# Patient Record
Sex: Male | Born: 1950 | Race: White | Hispanic: No | Marital: Married | Smoking: Never smoker
Health system: Southern US, Community
[De-identification: ages and names within clinical notes are randomized; demographics above are authoritative.]

## PROBLEM LIST (undated history)

## (undated) DIAGNOSIS — K5730 Diverticulosis of large intestine without perforation or abscess without bleeding: Secondary | ICD-10-CM

## (undated) DIAGNOSIS — N200 Calculus of kidney: Secondary | ICD-10-CM

## (undated) DIAGNOSIS — S46219A Strain of muscle, fascia and tendon of other parts of biceps, unspecified arm, initial encounter: Secondary | ICD-10-CM

## (undated) DIAGNOSIS — I10 Essential (primary) hypertension: Secondary | ICD-10-CM

## (undated) DIAGNOSIS — S43439A Superior glenoid labrum lesion of unspecified shoulder, initial encounter: Secondary | ICD-10-CM

## (undated) DIAGNOSIS — T7840XA Allergy, unspecified, initial encounter: Secondary | ICD-10-CM

## (undated) DIAGNOSIS — U071 COVID-19: Secondary | ICD-10-CM

## (undated) HISTORY — DX: COVID-19: U07.1

## (undated) HISTORY — DX: Calculus of kidney: N20.0

## (undated) HISTORY — DX: Essential (primary) hypertension: I10

## (undated) HISTORY — DX: Diverticulosis of large intestine without perforation or abscess without bleeding: K57.30

## (undated) HISTORY — DX: Allergy, unspecified, initial encounter: T78.40XA

---

## 2002-04-09 ENCOUNTER — Encounter: Payer: Self-pay | Admitting: Internal Medicine

## 2002-04-09 ENCOUNTER — Ambulatory Visit (HOSPITAL_COMMUNITY): Admission: RE | Admit: 2002-04-09 | Discharge: 2002-04-09 | Payer: Self-pay | Admitting: Internal Medicine

## 2004-02-21 ENCOUNTER — Ambulatory Visit: Payer: Self-pay | Admitting: Family Medicine

## 2004-09-01 ENCOUNTER — Ambulatory Visit: Payer: Self-pay | Admitting: Family Medicine

## 2005-02-09 ENCOUNTER — Ambulatory Visit: Payer: Self-pay | Admitting: Family Medicine

## 2005-02-12 ENCOUNTER — Ambulatory Visit: Payer: Self-pay | Admitting: Family Medicine

## 2005-04-06 ENCOUNTER — Ambulatory Visit: Payer: Self-pay | Admitting: Family Medicine

## 2005-04-06 ENCOUNTER — Ambulatory Visit: Payer: Self-pay | Admitting: *Deleted

## 2006-03-16 ENCOUNTER — Ambulatory Visit: Payer: Self-pay | Admitting: Family Medicine

## 2009-02-11 ENCOUNTER — Telehealth: Payer: Self-pay | Admitting: Family Medicine

## 2009-02-11 ENCOUNTER — Ambulatory Visit: Payer: Self-pay | Admitting: Family Medicine

## 2009-02-11 DIAGNOSIS — I10 Essential (primary) hypertension: Secondary | ICD-10-CM

## 2009-02-17 ENCOUNTER — Ambulatory Visit: Payer: Self-pay | Admitting: Family Medicine

## 2009-02-17 DIAGNOSIS — M19079 Primary osteoarthritis, unspecified ankle and foot: Secondary | ICD-10-CM | POA: Insufficient documentation

## 2009-02-18 DIAGNOSIS — E785 Hyperlipidemia, unspecified: Secondary | ICD-10-CM

## 2009-02-18 DIAGNOSIS — K5730 Diverticulosis of large intestine without perforation or abscess without bleeding: Secondary | ICD-10-CM | POA: Insufficient documentation

## 2009-03-05 HISTORY — PX: FOOT SURGERY: SHX648

## 2009-03-19 ENCOUNTER — Ambulatory Visit: Payer: Self-pay | Admitting: Family Medicine

## 2009-03-19 LAB — CONVERTED CEMR LAB
ALT: 15 units/L (ref 0–53)
AST: 21 units/L (ref 0–37)
Albumin: 4.1 g/dL (ref 3.5–5.2)
Alkaline Phosphatase: 66 units/L (ref 39–117)
BUN: 24 mg/dL — ABNORMAL HIGH (ref 6–23)
Basophils Absolute: 0.1 10*3/uL (ref 0.0–0.1)
Basophils Relative: 0.6 % (ref 0.0–3.0)
Bilirubin, Direct: 0 mg/dL (ref 0.0–0.3)
CO2: 28 meq/L (ref 19–32)
Calcium: 9.4 mg/dL (ref 8.4–10.5)
Chloride: 103 meq/L (ref 96–112)
Cholesterol: 210 mg/dL — ABNORMAL HIGH (ref 0–200)
Creatinine, Ser: 1.8 mg/dL — ABNORMAL HIGH (ref 0.4–1.5)
Direct LDL: 144.3 mg/dL
Eosinophils Absolute: 0.3 10*3/uL (ref 0.0–0.7)
Eosinophils Relative: 3 % (ref 0.0–5.0)
GFR calc non Af Amer: 41.61 mL/min (ref >60)
Glucose, Bld: 88 mg/dL (ref 70–99)
HCT: 41.2 % (ref 39.0–52.0)
HDL: 43.2 mg/dL (ref >39.00)
Hemoglobin: 13.6 g/dL (ref 13.0–17.0)
Lymphocytes Relative: 31.1 % (ref 12.0–46.0)
Lymphs Abs: 2.8 10*3/uL (ref 0.7–4.0)
MCHC: 33.1 g/dL (ref 30.0–36.0)
MCV: 87.4 fL (ref 78.0–100.0)
Monocytes Absolute: 0.7 10*3/uL (ref 0.1–1.0)
Monocytes Relative: 7.4 % (ref 3.0–12.0)
Neutro Abs: 5 10*3/uL (ref 1.4–7.7)
Neutrophils Relative %: 57.9 % (ref 43.0–77.0)
PSA: 0.83 ng/mL (ref 0.10–4.00)
Platelets: 306 10*3/uL (ref 150.0–400.0)
Potassium: 4.2 meq/L (ref 3.5–5.1)
RBC: 4.71 M/uL (ref 4.22–5.81)
RDW: 12.3 % (ref 11.5–14.6)
Sodium: 138 meq/L (ref 135–145)
TSH: 3.44 microintl units/mL (ref 0.35–5.50)
Total Bilirubin: 0.5 mg/dL (ref 0.3–1.2)
Total CHOL/HDL Ratio: 5
Total Protein: 7.4 g/dL (ref 6.0–8.3)
Triglycerides: 211 mg/dL — ABNORMAL HIGH (ref 0.0–149.0)
VLDL: 42.2 mg/dL — ABNORMAL HIGH (ref 0.0–40.0)
WBC: 8.9 10*3/uL (ref 4.5–10.5)

## 2009-03-20 LAB — CONVERTED CEMR LAB: Vit D, 25-Hydroxy: 40 ng/mL

## 2009-03-24 ENCOUNTER — Ambulatory Visit: Payer: Self-pay | Admitting: Family Medicine

## 2009-03-28 ENCOUNTER — Telehealth: Payer: Self-pay | Admitting: Family Medicine

## 2009-04-09 ENCOUNTER — Ambulatory Visit: Payer: Self-pay | Admitting: Family Medicine

## 2009-04-09 ENCOUNTER — Encounter (INDEPENDENT_AMBULATORY_CARE_PROVIDER_SITE_OTHER): Payer: Self-pay | Admitting: *Deleted

## 2009-04-09 LAB — CONVERTED CEMR LAB
OCCULT 1: NEGATIVE
OCCULT 2: NEGATIVE
OCCULT 3: NEGATIVE

## 2009-05-05 ENCOUNTER — Ambulatory Visit: Payer: Self-pay | Admitting: Family Medicine

## 2009-09-02 ENCOUNTER — Encounter (INDEPENDENT_AMBULATORY_CARE_PROVIDER_SITE_OTHER): Payer: Self-pay | Admitting: *Deleted

## 2010-02-22 LAB — CONVERTED CEMR LAB
ALT: 18 units/L (ref 0–53)
AST: 25 units/L (ref 0–37)
Albumin: 4.6 g/dL (ref 3.5–5.2)
Alkaline Phosphatase: 72 units/L (ref 39–117)
BUN: 21 mg/dL (ref 6–23)
Basophils Absolute: 0.1 10*3/uL (ref 0.0–0.1)
Basophils Relative: 0.6 % (ref 0.0–3.0)
Bilirubin, Direct: 0.1 mg/dL (ref 0.0–0.3)
CO2: 31 meq/L (ref 19–32)
Calcium: 9.6 mg/dL (ref 8.4–10.5)
Chloride: 103 meq/L (ref 96–112)
Cholesterol: 263 mg/dL — ABNORMAL HIGH (ref 0–200)
Creatinine, Ser: 1.4 mg/dL (ref 0.4–1.5)
Direct LDL: 193.6 mg/dL
Eosinophils Absolute: 0.2 10*3/uL (ref 0.0–0.7)
Eosinophils Relative: 1.4 % (ref 0.0–5.0)
GFR calc non Af Amer: 55.63 mL/min (ref >60)
Glucose, Bld: 86 mg/dL (ref 70–99)
HCT: 48.7 % (ref 39.0–52.0)
HDL: 33.6 mg/dL — ABNORMAL LOW (ref >39.00)
Hemoglobin: 15.8 g/dL (ref 13.0–17.0)
Lymphocytes Relative: 25.2 % (ref 12.0–46.0)
Lymphs Abs: 3.2 10*3/uL (ref 0.7–4.0)
MCHC: 32.5 g/dL (ref 30.0–36.0)
MCV: 87 fL (ref 78.0–100.0)
Monocytes Absolute: 0.5 10*3/uL (ref 0.1–1.0)
Monocytes Relative: 3.7 % (ref 3.0–12.0)
Neutro Abs: 8.6 10*3/uL — ABNORMAL HIGH (ref 1.4–7.7)
Neutrophils Relative %: 69.1 % (ref 43.0–77.0)
Platelets: 269 10*3/uL (ref 150.0–400.0)
Potassium: 3.6 meq/L (ref 3.5–5.1)
RBC: 5.59 M/uL (ref 4.22–5.81)
RDW: 12.6 % (ref 11.5–14.6)
Sodium: 141 meq/L (ref 135–145)
TSH: 3.77 microintl units/mL (ref 0.35–5.50)
Total Bilirubin: 0.8 mg/dL (ref 0.3–1.2)
Total CHOL/HDL Ratio: 8
Total Protein: 7.7 g/dL (ref 6.0–8.3)
Triglycerides: 321 mg/dL — ABNORMAL HIGH (ref 0.0–149.0)
VLDL: 64.2 mg/dL — ABNORMAL HIGH (ref 0.0–40.0)
WBC: 12.6 10*3/uL — ABNORMAL HIGH (ref 4.5–10.5)

## 2010-02-24 NOTE — Assessment & Plan Note (Signed)
Summary: CPX   Vital Signs:  Patient profile:   60 year old male Weight:      216 pounds Temp:     98.9 degrees F oral Pulse rate:   76 / minute Pulse rhythm:   regular BP sitting:   140 / 84  (left arm) Cuff size:   large  Vitals Entered By: Sydell Axon LPN (March 24, 2009 2:52 PM) CC: 30 Minute checkup, hemoccult cards given to patient, recovering from foot surgery, feels tired all the time and wants to know could BP medication be causing that?   History of Present Illness: Pt here for Comp Exam. He had foot surgery 2 weeks ago and had a bone spur removed...now can stand up without pain. He had signif problems.  Pt then had anesthesia and has been on BP meds approx 3 weeks and he has been tired.   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 1 By Mouth Once Daily in Am 2)  Famciclovir 250 Mg Tabs (Famciclovir) .... Take One By Mouth Two Times A Day For 5 Days As Needed  Allergies: No Known Drug Allergies  Past History:  Past Surgical History: ABD CT- Divertics  Pelvic CT - WNL Bone spur surgery of Foot (Dr Lestine Box) 2/9 2011  Family History: Father: dec 65 with emphysema, he was a smoker and a drinker Mother: dec 93 Natural Causes Brother dec  hypertension and alcoholism septic s/p gallbladder Brother A 59  hypertension CAD Brother dec 63 septic s/p  hernia repair  Sister A 84 Sister A 66 Sister A 47 son with HTN  fam hx of HTN  Social History: Caffeine use/day:  1 Does Patient Exercise:  no  Review of Systems General:  Complains of fatigue; denies chills, fever, sweats, weakness, and weight loss. Eyes:  Denies blurring, discharge, double vision, and eye pain. ENT:  Denies decreased hearing, earache, and ringing in ears. CV:  Denies chest pain or discomfort, fainting, fatigue, palpitations, shortness of breath with exertion, swelling of feet, and swelling of hands. Resp:  Denies cough, shortness of breath, and wheezing. GI:  Denies abdominal pain, bloody stools, change in bowel habits, constipation, dark tarry stools, diarrhea, excessive appetite, loss of appetite, nausea, vomiting, vomiting blood, and yellowish skin color. GU:  Denies discharge, dysuria, nocturia, and urinary frequency. MS:  Denies joint pain, low back pain, muscle aches, muscle, muscle weakness, and stiffness. 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 Neuro:  Denies numbness, poor balance, tingling, and tremors.  Physical Exam  General:  overweight but generally well appearing  Head:  Normocephalic and atraumatic without obvious abnormalities. Longterm male pattern  balding. Eyes:  Conjunctiva clear bilaterally.  Ears:  External ear exam shows no significant lesions or deformities.  Otoscopic examination reveals clear canals, tympanic membranes  are intact bilaterally without bulging, retraction, inflammation or discharge. Hearing is grossly normal bilaterally. Nose:  External nasal examination shows no deformity or inflammation. Nasal mucosa are pink and moist without lesions or exudates. Mouth:  Oral mucosa and oropharynx without lesions or exudates.  Teeth in good repair. Neck:  supple with full rom and no masses or thyromegally, no JVD or carotid bruit  Chest Wall:   No deformities, masses, tenderness or gynecomastia noted. Breasts:  No masses or gynecomastia noted Lungs:  Normal respiratory effort, chest expands symmetrically. Lungs are clear to auscultation, no crackles or wheezes. Heart:  Normal rate and regular rhythm. S1 and S2 normal without gallop, murmur, click, rub or other extra sounds. Abdomen:  Bowel sounds positive,abdomen soft and non-tender without masses, organomegaly or hernias noted. no renal bruits, mildly protuberant. Rectal:  No external abnormalities noted. Normal sphincter tone. No rectal masses or tenderness. G neg. Genitalia:  Testes bilaterally descended without nodularity, tenderness or masses. No scrotal masses or lesions. No penis lesions or urethral discharge. Prostate:  Prostate gland firm and smooth, no enlargement, nodularity, tenderness, mass, asymmetry or induration. 20gms. Msk:  No deformity or scoliosis noted of thoracic or lumbar spine.   Pulses:  R and L carotid,radial,femoral,dorsalis pedis and posterior tibial pulses are full and equal bilaterally Extremities:  No clubbing, cyanosis, edema, or deformity noted with normal full range of motion of all joints.   Neurologic:  No cranial nerve deficits noted. Station and gait are normal.  Sensory, motor and coordinative functions appear intact. Skin:  Intact without suspicious lesions or rashes ecept 1.5 cm flaky chronically inflamed area of macular rash on the right facial temple. Cervical Nodes:  No lymphadenopathy noted Inguinal Nodes:  No significant adenopathy Psych:  Cognition and judgment appear intact. Alert and cooperative with normal attention span and concentration. 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 No trmt needed.  Problem # 3:   DIVERTICULOSIS, COLON (ICD-562.10) Assessment: Unchanged Discussed being seen for LLQ pain for more than two days.  Problem # 4:  HYPERLIPIDEMIA (ICD-272.4) Assessment: Unchanged Needs to be lower. Will discuss in the future. Declines meds at present. Labs Reviewed: SGOT: 21 (03/19/2009)   SGPT: 15 (03/19/2009)   HDL:43.20 (03/19/2009), 33.60 (02/11/2009)  Chol:210 (03/19/2009), 263 (02/11/2009)  Trig:211.0 (03/19/2009), 321.0 (02/11/2009) LDL 144 (03/19/2009)   Problem # 5:  HYPERTENSION, BENIGN ESSENTIAL (ICD-401.1) Assessment: Unchanged Change to Amlodipine due top fatigue and kidney function blunting. Recheck 6 weeks. His updated medication list for this problem includes:    Amlodipine Besylate 5 Mg Tabs (Amlodipine besylate) ..... One tab by mouth at night  BP today: 140/84 Prior BP: 142/90 (02/17/2009)  Labs Reviewed: K+: 4.2 (03/19/2009) Creat: : 1.8 (03/19/2009)   Chol: 210 (03/19/2009)   HDL: 43.20 (03/19/2009)   TG: 211.0 (03/19/2009)  Complete Medication List: 1)  Amlodipine Besylate 5 Mg Tabs (Amlodipine besylate) .... One tab by mouth at night 2)  Famciclovir 250 Mg Tabs (Famciclovir) .... Take one by mouth two times a day for 5 days as needed  Patient Instructions: 1)  RTC 6 weeks for BP check 2)  Tdap next time  Prescriptions: AMLODIPINE BESYLATE 5 MG TABS (AMLODIPINE BESYLATE) one tab by mouth at night  #30 x 12   Entered and Authorized by:   Shaune Leeks MD  Signed by:   Shaune Leeks MD on 03/24/2009   Method used:   Electronically to        [facility] [id number] (retail[facility]       [address]       [place], [place]  [address]       Ph: [phone] or [phone]       Fax: [phone]   RxID:   [id number]    Tetanus/Td Immunization History:    Tetanus/Td # 1:  Historical (06/25/2005)  Influenza Immunization History:    Influenza # 1:  Fluvax 3+ (12/25/2008) Received both vaccines at work per patient

## 2010-02-24 NOTE — Letter (Signed)
Summary: Nadara Eaton letter  [place] at [facility]  [address] [place], [place] [address]   Phone: [phone]  Fax: [phone]       09/02/2009 MRN: [id number]  TABB CROGHAN [address] [place], [place]  [address]  Dear Mr. Lelon Huh [facility] [place] and [place] announce the retirement of Arta Silence, M.D., from full-time practice at the [facility] office effective July 24, 2009 and his plans of returning part-time.  It is important to Dr. Hetty Ely and to our practice that you understand that [facility] has seven physicians in our office for your health care needs.  We will continue to offer the same exceptional care that you have today.    Dr. Hetty Ely has spoken to many of you about his plans for retirement and returning part-time in the fall.   We will continue to work with you through the transition to schedule appointments for you in the office and meet the high standards that [place] is committed to.   Again, it is with great pleasure that we share the news that Dr. Hetty Ely will return to [facility] at [facility] in October of 2011 with a reduced schedule.    If you have any questions, or would like to request an appointment with one of our physicians, please call us at [phone] and press the option for Scheduling an appointment.  We take pleasure in providing you with excellent patient care and look forward to seeing you at your next office visit.  Our [facility] Physicians are:  Tillman Abide, M.D. Laurita Quint, M.D. Roxy Manns, M.D. Kerby Nora, M.D. Hannah Beat, M.D. Ruthe Mannan, M.D. We proudly welcomed Raechel Ache, M.D. and Eustaquio Boyden, M.D. to the practice in July/August 2011.  Sincerely,  [facility]

## 2010-02-24 NOTE — Progress Notes (Signed)
Summary: amlodipine is causing cough  Phone Note Call from Patient Call back at Work Phone [phone] Call back at [phone]   Caller: Patient Call For: Shaune Leeks MD Summary of Call: Pt called to report that the amlodipine is giving him a constant cough.  He is going to try taking one half tablet through the week end and see how he does.  [facility], please advise.  Pt knows you are out today. Initial call taken by: Lowella Petties CMA,  March 28, 2009 12:09 PM  Follow-up for Phone Call        Pls have pt come in if cough continues. Follow-up by: Shaune Leeks MD,  March 31, 2009 8:08 AM  Additional Follow-up for Phone Call Additional follow up Details #1::        Pt called back and said he is better, cough is better, will continue with current medicine. Additional Follow-up by: Lowella Petties CMA,  March 31, 2009 8:32 AM    Additional Follow-up for Phone Call Additional follow up Details #2::    Noted. Follow-up by: Shaune Leeks MD,  March 31, 2009 8:41 AM

## 2010-02-24 NOTE — Letter (Signed)
Summary: Results Follow up Letter  [place] at [facility]  [address] [place], [place] [address]   Phone: [phone]  Fax: [phone]    04/09/2009 MRN: [id number]  Bryce Villanueva [address] [place], [place]  [address]  Dear Mr. Plaisted,  The following are the results of your recent test(s):  Test         Result    Pap Smear:        Normal _____  Not Normal _____ Comments: ______________________________________________________ Cholesterol: LDL(Bad cholesterol):         Your goal is less than:         HDL (Good cholesterol):       Your goal is more than: Comments:  ______________________________________________________ Mammogram:        Normal _____  Not Normal _____ Comments:  ___________________________________________________________________ Hemoccult:        Normal __X___  Not normal _______ Comments:  Please repeat in one year.  _____________________________________________________________________ Other Tests:    We routinely do not discuss normal results over the telephone.  If you desire a copy of the results, or you have any questions about this information we can discuss them at your next office visit.   Sincerely,     Laurita Quint, MD

## 2010-02-24 NOTE — Assessment & Plan Note (Signed)
Summary: high bp/dlo   Vital Signs:  Patient profile:   60 year old male Height:      68 inches Weight:      214.75 pounds BMI:     32.77 Temp:     99.0 degrees F oral Pulse rate:   80 / minute Pulse rhythm:   regular BP sitting:   200 / 118  (left arm) Cuff size:   large  Vitals Entered By: Lowella Petties CMA (February 11, 2009 3:10 PM) CC: Elevated BP   History of Present Illness: was sent here for high bp  has a family hx - but no personal hx  started checking it at home -- feels pressure in his head /  and ears are red  his cuff is reading too high  no cardiac hx  is generally very healthy  hx of high chol  has healthy diet and does exercise  does like salt but is careful with it   has PE coming up next mo     Allergies (verified): No Known Drug Allergies  Past History:  Family History: Last updated: 02/11/2009 son with HTN  fam hx of HTN  Social History: Last updated: 02/11/2009 non smoker no alcohol  Past Medical History: HTN  hyperlipidemia   Family History: son with HTN  fam hx of HTN  Social History: non smoker no alcohol  Review of Systems General:  Denies fatigue, fever, loss of appetite, and malaise. Eyes:  Denies blurring and eye irritation. CV:  Denies chest pain or discomfort, lightheadness, palpitations, and shortness of breath with exertion. Resp:  Denies cough, shortness of breath, and wheezing. GI:  Denies abdominal pain, change in bowel habits, and indigestion. GU:  Denies urinary frequency. MS:  Denies muscle aches and cramps. Derm:  Denies lesion(s), poor wound healing, and rash. Neuro:  Denies headaches, memory loss, numbness, and tingling. Psych:  Denies anxiety and depression. Endo:  Denies cold intolerance, excessive thirst, excessive urination, and heat intolerance. Heme:  Denies abnormal bruising and bleeding.  Physical Exam  General:  overweight but generally well appearing  Head:  normocephalic, atraumatic,  and no abnormalities observed.   Eyes:  vision grossly intact, pupils equal, pupils round, and pupils reactive to light.  no conjunctival pallor, injection or icterus  Ears:  R ear normal and L ear normal.   Nose:  no nasal discharge.   Mouth:  pharynx pink and moist.   Neck:  supple with full rom and no masses or thyromegally, no JVD or carotid bruit  Chest Wall:  No deformities, masses, tenderness or gynecomastia noted. Lungs:  Normal respiratory effort, chest expands symmetrically. Lungs are clear to auscultation, no crackles or wheezes. Heart:  Normal rate and regular rhythm. S1 and S2 normal without gallop, murmur, click, rub or other extra sounds. Abdomen:  Bowel sounds positive,abdomen soft and non-tender without masses, organomegaly or hernias noted. no renal bruits  Msk:  No deformity or scoliosis noted of thoracic or lumbar spine.   Pulses:  R and L carotid,radial,femoral,dorsalis pedis and posterior tibial pulses are full and equal bilaterally Extremities:  No clubbing, cyanosis, edema, or deformity noted with normal full range of motion of all joints.   Neurologic:  sensation intact to light touch, gait normal, and DTRs symmetrical and normal.   Skin:  Intact without suspicious lesions or rashes Cervical Nodes:  No lymphadenopathy noted Psych:  normal affect, talkative and pleasant    Impression & Recommendations:  Problem # 1:  HYPERTENSION,  BENIGN ESSENTIAL (ICD-401.1) Assessment New new dx of HTN in pt with family hx and also hyperlipidemia  disc nature of HTN and tx for it  disc lifestyle change labs today start lisinopril hct - call if side eff will monitor at home f/u Dr Hetty Ely in several days  His updated medication list for this problem includes:    Zestoretic 10-12.5 Mg Tabs (Lisinopril-hydrochlorothiazide) .Marland Kitchen... 1 by mouth once daily in am  Orders: Venipuncture ([address]) TLB-Lipid Panel (80061-LIPID) TLB-BMP (Basic Metabolic Panel-BMET)  (80048-METABOL) TLB-CBC Platelet - w/Differential (85025-CBCD) TLB-Hepatic/Liver Function Pnl (80076-HEPATIC) TLB-TSH (Thyroid Stimulating Hormone) (84443-TSH)  Problem # 2:  HYPERLIPIDEMIA (ICD-272.4) Assessment: New hx of hyperlipidemia - unsure how high  check labs - to disc at f/u with Dr Hetty Ely  Complete Medication List: 1)  Zestoretic 10-12.5 Mg Tabs (Lisinopril-hydrochlorothiazide) .Marland Kitchen.. 1 by mouth once daily in am  Patient Instructions: 1)  start lisinopril hct 1 pill daily  2)  if any side effects update me -- especially dizziness 3)  avoid salt , drink more water  4)  labs today  5)  follow up with Dr Hetty Ely at end of week  Prescriptions: ZESTORETIC 10-12.5 MG TABS (LISINOPRIL-HYDROCHLOROTHIAZIDE) 1 by mouth once daily in am  #30 x 1   Entered and Authorized by:   Judith Part MD   Signed by:   Judith Part MD on 02/11/2009   Method used:   Print then Give to Patient   RxID:   [id number]   Prior Medications: Current Allergies (reviewed today): No known allergies    EKG  Procedure date:  02/11/2009  Findings:      NSR with rate of 82 and no acute changes

## 2010-02-24 NOTE — Progress Notes (Signed)
Summary: Elevatted BP  Phone Note Call from Patient   Caller: Patient Summary of Call: Pt is at work and stopped at nurses office to get BP taken. BP now is 230/112 pulse 84. BP last taken at work 05/2008 and was 160/100. Pt said has been unable to take BP at home because the BP cuff could not read. Pt has no h/a, no SOB and no chest pain. Pt feels tired and has rt lower back pain. Pt is presently on no medication. Pt has scheduled appt to see Dr. Hetty Ely on 03/24/09. Dr Milinda Antis said pt could come and nurse take BP let pt lay down and either Dr. Milinda Antis or Dr. Ermalene Searing would see pt. Pt said he would be here in 30 minutes. Initial call taken by: Lewanda Rife LPN,  February 11, 2009 2:37 PM

## 2010-02-24 NOTE — Assessment & Plan Note (Signed)
Summary: FOLLOW UP   Vital Signs:  Patient profile:   60 year old male Weight:      213 pounds Temp:     98.7 degrees F oral Pulse rate:   60 / minute Pulse rhythm:   regular BP sitting:   142 / 90  (left arm) Cuff size:   large  Vitals Entered By: Sydell Axon LPN (February 17, 2009 11:35 AM) CC: Follow-up on BP   History of Present Illness: Pt here for BP check. He had significantly elevated BP when seen last week and was started on medication. He felt bad last week and came in because of this. He also has pain in the lateral side of the right leg, just bove the LCL origin.  Healso has severe right ankle pain and has been foillowed by Ortho for bone chips and arthritis after a bad ankle fracture. He is looking at surgery and thinking he will have it as he has constant pain, worse at night with swelling after being on it all day.  Problems Prior to Update: 1)  Hyperlipidemia  (ICD-272.4) 2)  Hypertension, Benign Essential  (ICD-401.1)  Medications Prior to Update: 1)  Zestoretic 10-12.5 Mg Tabs (Lisinopril-Hydrochlorothiazide) .Marland Kitchen.. 1 By Mouth Once Daily in Am  Allergies: No Known Drug Allergies  Physical Exam  General:  overweight but generally well appearing  Head:  normocephalic, atraumatic, and no abnormalities observed.   Eyes:  vision grossly intact, pupils equal, pupils round, and pupils reactive to light.  no conjunctival pallor, injection or icterus  Ears:  R ear normal and L ear normal.   Nose:  no nasal discharge.   Mouth:  pharynx pink and moist.   Neck:  supple with full rom and no masses or thyromegally, no JVD or carotid bruit  Chest Wall:  No deformities, masses, tenderness or gynecomastia noted. Lungs:  Normal respiratory effort, chest expands symmetrically. Lungs are clear to auscultation, no crackles or wheezes. Heart:  Normal rate and regular rhythm. S1 and S2 normal without gallop, murmur, click, rub or other extra sounds. Abdomen:  Bowel sounds  positive,abdomen soft and non-tender without masses, organomegaly or hernias noted. no renal bruits  Msk:  R leg with discomfort at the distal insertion of the ileotibeal band just above the jointline/LCL origin, no swelling or FB noted. Extremities:  L ankle with chronic swelling at lateral malleolar process. No redness or crepitus noted.   Impression & Recommendations:  Problem # 1:  HYPERTENSION, BENIGN ESSENTIAL (ICD-401.1) Assessment Improved Significant improvement over last week. Discussed avioi9ding salt and getiing on good diet. His updated medication list for this problem includes:    Zestoretic 10-12.5 Mg Tabs (Lisinopril-hydrochlorothiazide) .Marland Kitchen... 1 by mouth once daily in am  BP today: 142/90 Prior BP: 200/118 (02/11/2009)  Labs Reviewed: K+: 3.6 (02/11/2009) Creat: : 1.4 (02/11/2009)   Chol: 263 (02/11/2009)   HDL: 33.60 (02/11/2009)   TG: 321.0 (02/11/2009)  Problem # 2:  HYPERLIPIDEMIA (ICD-272.4) Assessment: Deteriorated Signioficantly elevated LDL and Trigs, mildly decr HDL. Discussed diet and exercise to inmprove. Labs Reviewed: SGOT: 25 (02/11/2009)   SGPT: 18 (02/11/2009)   HDL:33.60 (02/11/2009)  Chol:263 (02/11/2009)  Trig:321.0 (02/11/2009)  Problem # 3:  ILIOTIBIAL BAND SYNDROME, RIGHT (ICD-728.89) Assessment: New Discussed avoiding stressing area. Heat and ice as described. Tyl as needed. Avoid NSAIDs.  Problem # 4:  ARTHRITIS, RIGHT ANKLE (ICD-716.97) Assessment: Deteriorated S/ signif fracture in the remote pasty. Discussed his desire for surgery. Will follow.  Complete Medication List: 1)  Zestoretic 10-12.5 Mg Tabs (Lisinopril-hydrochlorothiazide) .Marland Kitchen.. 1 by mouth once daily in am 2)  Famciclovir 250 Mg Tabs (Famciclovir) .... Take one by mouth two times a day for 5 days as needed  Patient Instructions: 1)  Keep appt for Comp Exam.  2)  Tdap next time. 3)  FOllow strict diet as outklined for max attempt at lowering LDL and Trigs.  Current  Allergies (reviewed today): No known allergies

## 2010-02-24 NOTE — Assessment & Plan Note (Signed)
Summary: 6WK F/U FOR BP CHECK / LFW   Vital Signs:  Patient profile:   60 year old male Weight:      216.50 pounds Temp:     98.3 degrees F oral Pulse rate:   64 / minute Pulse rhythm:   regular BP sitting:   132 / 84  (left arm) Cuff size:   large  Vitals Entered By: Sydell Axon LPN (May 05, 2009 3:30 PM) CC: 6 Week follow-up on BP   History of Present Illness: Pt here for recheck of his BP. We switched laast time to Amlodipine due to fatigue with Zestoretic. He feels well today and has his old energy back. He feels well and is tolerating the medication well. He complains today of frequncy of urination, says he has alsways had the problem but has gotten worse from since he was started on Bp meds. He denies hesitancy, dribbling poor flow but does have frequency and nocturia every 2 hrs. He says he has always had "a little bit of this" most of his life and that his brother had this, was put on a medication which he couldn't tolerate (which he doesn't know what it is) and had to stop it.  Problems Prior to Update: 1)  Special Screening Malig Neoplasms Other Sites  (ICD-V76.49) 2)  Health Maintenance Exam  (ICD-V70.0) 3)  Herpes Zoster I, Right Temple  (ICD-053.9) 4)  Special Screening Malignant Neoplasm of Prostate  (ICD-V76.44) 5)  Diverticulosis, Colon  (ICD-562.10) 6)  Arthritis, Right Ankle  (ICD-716.97) 7)  Hyperlipidemia  (ICD-272.4) 8)  Hypertension, Benign Essential  (ICD-401.1)  Medications Prior to Update: 1)  Amlodipine Besylate 5 Mg Tabs (Amlodipine Besylate) .... One Tab By Mouth At Night 2)  Famciclovir 250 Mg Tabs (Famciclovir) .... Take One By Mouth Two Times A Day For 5 Days As Needed  Allergies: No Known Drug Allergies  Physical Exam  General:  overweight but generally well appearing  Head:  Normocephalic and atraumatic without obvious abnormalities. Longterm male pattern  balding. Eyes:  Conjunctiva clear bilaterally.  Ears:  External ear exam shows no  significant lesions or deformities.  Otoscopic examination reveals clear canals, tympanic membranes are intact bilaterally without bulging, retraction, inflammation or discharge. Hearing is grossly normal bilaterally. Nose:  External nasal examination shows no deformity or inflammation. Nasal mucosa are pink and moist without lesions or exudates. Mouth:  Oral mucosa and oropharynx without lesions or exudates.  Teeth in good repair. Neck:  supple with full rom and no masses or thyromegally, no JVD or carotid bruit  Chest Wall:  No deformities, masses, tenderness or gynecomastia noted. Lungs:  Normal respiratory effort, chest expands symmetrically. Lungs are clear to auscultation, no crackles or wheezes. Heart:  Normal rate and regular rhythm. S1 and S2 normal without gallop, murmur, click, rub or other extra sounds. Abdomen:  Bowel sounds positive,abdomen soft and non-tender without masses, organomegaly or hernias noted.   Impression & Recommendations:  Problem # 1:  HYPERTENSION, BENIGN ESSENTIAL (ICD-401.1) Assessment Improved Cont Amlodipine. His updated medication list for this problem includes:    Amlodipine Besylate 5 Mg Tabs (Amlodipine besylate) ..... One tab by mouth at night  BP today: 132/84 Prior BP: 140/84 (03/24/2009)  Labs Reviewed: K+: 4.2 (03/19/2009) Creat: : 1.8 (03/19/2009)   Chol: 210 (03/19/2009)   HDL: 43.20 (03/19/2009)   TG: 211.0 (03/19/2009)  Problem # 2:  URINARY FREQUENCY WITH NOCTURIA (ICD-788.41) Assessment: New  Will try trial of Flomax. Will give answer in one  week. If helps see me to try alpha blocker. Discussed pathophysiology of BPH and bladder function. May need Urology referral. His updated medication list for this problem includes:    Flomax 0.4 Mg Caps (Tamsulosin hcl) ..... One tab by mouth once daily  Discussed use of medication.   Complete Medication List: 1)  Amlodipine Besylate 5 Mg Tabs (Amlodipine besylate) .... One tab by mouth at  night 2)  Famciclovir 250 Mg Tabs (Famciclovir) .... Take one by mouth two times a day for 5 days as needed 3)  Flomax 0.4 Mg Caps (Tamsulosin hcl) .... One tab by mouth once daily Prescriptions: FLOMAX 0.4 MG CAPS (TAMSULOSIN HCL) one tab by mouth once daily  #30 x 12   Entered and Authorized by:   Shaune Leeks MD   Signed by:   Shaune Leeks MD on 05/05/2009   Method used:   Electronically to        [facility]  [place] (retail)       [address]       [place], [place]  [address]       Ph: [phone] or [phone]       Fax: [phone]   RxID:   [id number]   Current Allergies (reviewed today): No known allergies

## 2010-03-21 ENCOUNTER — Inpatient Hospital Stay (INDEPENDENT_AMBULATORY_CARE_PROVIDER_SITE_OTHER)
Admission: RE | Admit: 2010-03-21 | Discharge: 2010-03-21 | Disposition: A | Discharge status: Home / Self Care | Payer: BC Managed Care – PPO | Source: Ambulatory Visit | Attending: Family Medicine | Admitting: Family Medicine

## 2010-03-21 ENCOUNTER — Ambulatory Visit (INDEPENDENT_AMBULATORY_CARE_PROVIDER_SITE_OTHER): Payer: BC Managed Care – PPO

## 2010-03-21 DIAGNOSIS — J019 Acute sinusitis, unspecified: Secondary | ICD-10-CM

## 2010-03-21 DIAGNOSIS — J40 Bronchitis, not specified as acute or chronic: Secondary | ICD-10-CM

## 2010-05-09 ENCOUNTER — Encounter: Payer: Self-pay | Admitting: Family Medicine

## 2010-05-17 ENCOUNTER — Other Ambulatory Visit: Payer: Self-pay | Admitting: Family Medicine

## 2010-05-18 ENCOUNTER — Other Ambulatory Visit: Payer: Self-pay | Admitting: Family Medicine

## 2010-06-04 ENCOUNTER — Other Ambulatory Visit: Payer: Self-pay | Admitting: Family Medicine

## 2010-06-04 DIAGNOSIS — E785 Hyperlipidemia, unspecified: Secondary | ICD-10-CM

## 2010-06-04 DIAGNOSIS — Z125 Encounter for screening for malignant neoplasm of prostate: Secondary | ICD-10-CM

## 2010-06-04 DIAGNOSIS — I10 Essential (primary) hypertension: Secondary | ICD-10-CM

## 2010-06-04 DIAGNOSIS — K5730 Diverticulosis of large intestine without perforation or abscess without bleeding: Secondary | ICD-10-CM

## 2010-06-05 ENCOUNTER — Other Ambulatory Visit (INDEPENDENT_AMBULATORY_CARE_PROVIDER_SITE_OTHER): Payer: BC Managed Care – PPO | Admitting: Family Medicine

## 2010-06-05 DIAGNOSIS — Z125 Encounter for screening for malignant neoplasm of prostate: Secondary | ICD-10-CM

## 2010-06-05 DIAGNOSIS — E785 Hyperlipidemia, unspecified: Secondary | ICD-10-CM

## 2010-06-05 DIAGNOSIS — Z13220 Encounter for screening for lipoid disorders: Secondary | ICD-10-CM

## 2010-06-05 DIAGNOSIS — K5730 Diverticulosis of large intestine without perforation or abscess without bleeding: Secondary | ICD-10-CM

## 2010-06-05 DIAGNOSIS — I10 Essential (primary) hypertension: Secondary | ICD-10-CM

## 2010-06-05 LAB — CBC WITH DIFFERENTIAL/PLATELET
Basophils Absolute: 0 10*3/uL (ref 0.0–0.1)
Basophils Relative: 0.5 % (ref 0.0–3.0)
Eosinophils Absolute: 0.2 10*3/uL (ref 0.0–0.7)
Lymphocytes Relative: 32.8 % (ref 12.0–46.0)
MCHC: 33.7 g/dL (ref 30.0–36.0)
MCV: 86.2 fl (ref 78.0–100.0)
Monocytes Absolute: 0.7 10*3/uL (ref 0.1–1.0)
Neutrophils Relative %: 57.5 % (ref 43.0–77.0)
Platelets: 258 10*3/uL (ref 150.0–400.0)
RDW: 14 % (ref 11.5–14.6)

## 2010-06-05 LAB — HEPATIC FUNCTION PANEL
ALT: 17 U/L (ref 0–53)
AST: 21 U/L (ref 0–37)
Alkaline Phosphatase: 70 U/L (ref 39–117)
Bilirubin, Direct: 0.1 mg/dL (ref 0.0–0.3)
Total Bilirubin: 0.7 mg/dL (ref 0.3–1.2)

## 2010-06-05 LAB — BASIC METABOLIC PANEL
BUN: 23 mg/dL (ref 6–23)
CO2: 25 mEq/L (ref 19–32)
Calcium: 9.6 mg/dL (ref 8.4–10.5)
Chloride: 106 mEq/L (ref 96–112)
Creatinine, Ser: 1.4 mg/dL (ref 0.4–1.5)
Glucose, Bld: 85 mg/dL (ref 70–99)

## 2010-06-05 LAB — LIPID PANEL
Total CHOL/HDL Ratio: 5
Triglycerides: 153 mg/dL — ABNORMAL HIGH (ref 0.0–149.0)

## 2010-06-05 LAB — TSH: TSH: 3.44 u[IU]/mL (ref 0.35–5.50)

## 2010-06-08 ENCOUNTER — Other Ambulatory Visit: Payer: Self-pay

## 2010-06-11 ENCOUNTER — Ambulatory Visit (INDEPENDENT_AMBULATORY_CARE_PROVIDER_SITE_OTHER): Payer: BC Managed Care – PPO | Admitting: Family Medicine

## 2010-06-11 ENCOUNTER — Encounter: Payer: Self-pay | Admitting: Family Medicine

## 2010-06-11 DIAGNOSIS — I10 Essential (primary) hypertension: Secondary | ICD-10-CM

## 2010-06-11 DIAGNOSIS — J309 Allergic rhinitis, unspecified: Secondary | ICD-10-CM

## 2010-06-11 DIAGNOSIS — N400 Enlarged prostate without lower urinary tract symptoms: Secondary | ICD-10-CM

## 2010-06-11 DIAGNOSIS — Z0000 Encounter for general adult medical examination without abnormal findings: Secondary | ICD-10-CM

## 2010-06-11 DIAGNOSIS — M19079 Primary osteoarthritis, unspecified ankle and foot: Secondary | ICD-10-CM

## 2010-06-11 DIAGNOSIS — K5730 Diverticulosis of large intestine without perforation or abscess without bleeding: Secondary | ICD-10-CM

## 2010-06-11 DIAGNOSIS — E785 Hyperlipidemia, unspecified: Secondary | ICD-10-CM

## 2010-06-11 MED ORDER — FINASTERIDE 5 MG PO TABS: 5.0000 mg | ORAL_TABLET | Freq: Every day | ORAL | Status: DC

## 2010-06-11 MED ORDER — FLUTICASONE PROPIONATE 50 MCG/ACT NA SUSP: 2.0000 | Freq: Every day | NASAL | Status: DC

## 2010-06-11 NOTE — Assessment & Plan Note (Signed)
Do ROM via alphabet, comparing to alternate ankle.

## 2010-06-11 NOTE — Assessment & Plan Note (Signed)
Really should be put on statin. He has not been watching his diet. Will give 3 mos to get under control and recheck, on statin if still high. Lab Results  Component Value Date   CHOL 232* 06/05/2010   CHOL 210* 03/19/2009   CHOL 263* 02/11/2009   Lab Results  Component Value Date   HDL 43.40 06/05/2010   HDL 52.84 03/19/2009   HDL 13.24* 02/11/2009   No results found for this basename: LDLCALC   Lab Results  Component Value Date   TRIG 153.0* 06/05/2010   TRIG 211.0* 03/19/2009   TRIG 321.0* 02/11/2009   Lab Results  Component Value Date   CHOLHDL 5 06/05/2010   CHOLHDL 5 03/19/2009   CHOLHDL 8 02/11/2009   Lab Results  Component Value Date   LDLDIRECT 170.6 06/05/2010   LDLDIRECT 144.3 03/19/2009   LDLDIRECT 193.6 02/11/2009

## 2010-06-11 NOTE — Assessment & Plan Note (Signed)
Pt hesitant to increase meds. Is under stress/pressure and has white coat syndrome. Will follow for three mos and recheck. BP Readings from Last 3 Encounters:  06/11/10 144/80  05/05/09 132/84  03/24/09 140/84

## 2010-06-11 NOTE — Assessment & Plan Note (Signed)
Come in for LLQ discomfort more than 2 days in duration.

## 2010-06-11 NOTE — Progress Notes (Signed)
  Subjective:    Patient ID: Bryce Villanueva, male    DOB: 17-Apr-1950, 60 y.o.   MRN: [id number]  HPI Pt here for Comp Exam. Last time last year we readjusted BP meds to make more affordable. He is under pressure and doesn't like being here. He has had lots of allergy problems and uses lots of OTC meds for allergies. He has used Flonase with good results in the past. He would like a prescription.    Review of Systems  Constitutional: Negative for fever, chills, diaphoresis, appetite change, fatigue and unexpected weight change.  HENT: Negative for ear pain, tinnitus and ear discharge. Hearing loss: mild, chronic.   Eyes: Negative for pain, discharge and visual disturbance.       Has new glasses.  Respiratory: Negative for cough, shortness of breath and wheezing.   Cardiovascular: Negative for chest pain and palpitations.       No SOB w/ exertion  Gastrointestinal: Negative for nausea, vomiting, abdominal pain, diarrhea, constipation and blood in stool.       No heartburn or swallowing problems.  Genitourinary: Positive for frequency. Negative for dysuria and difficulty urinating.       No nocturia  Musculoskeletal: Negative for myalgias, back pain and arthralgias.  Skin: Negative for rash.       No itching or dryness.  Neurological: Negative for tremors and numbness.       No tingling or balance problems.  Hematological: Negative for adenopathy. Does not bruise/bleed easily.  Psychiatric/Behavioral: Negative for dysphoric mood and agitation.       Objective:   Physical Exam  Constitutional: He is oriented to person, place, and time. He appears well-developed and well-nourished. No distress.  HENT:  Head: Normocephalic and atraumatic.  Right Ear: External ear normal.  Left Ear: External ear normal.  Nose: Nose normal.  Mouth/Throat: Oropharynx is clear and moist.  Eyes: Conjunctivae and EOM are normal. Pupils are equal, round, and reactive to light. Right eye exhibits no discharge.  Left eye exhibits no discharge. No scleral icterus.  Neck: Normal range of motion. Neck supple. No thyromegaly present.  Cardiovascular: Normal rate, regular rhythm, normal heart sounds and intact distal pulses.   No murmur heard. Pulmonary/Chest: Effort normal and breath sounds normal. No respiratory distress. He has no wheezes.  Abdominal: Soft. Bowel sounds are normal. He exhibits no distension and no mass. There is no tenderness. There is no rebound and no guarding.  Genitourinary: Rectum normal, prostate normal and penis normal. Guaiac negative stool.       Prostate 30-40gms.  Musculoskeletal: Normal range of motion. He exhibits no edema.  Lymphadenopathy:    He has no cervical adenopathy.  Neurological: He is alert and oriented to person, place, and time. Coordination normal.  Skin: Skin is warm and dry. No rash noted. He is not diaphoretic.  Psychiatric: He has a normal mood and affect. His behavior is normal. Judgment and thought content normal.          Assessment & Plan:

## 2010-06-11 NOTE — Patient Instructions (Signed)
RTC 3 mos, recheck BP and chol. May need statin med. May need BP adjustment.

## 2010-07-21 ENCOUNTER — Other Ambulatory Visit: Payer: BC Managed Care – PPO

## 2010-08-03 ENCOUNTER — Telehealth: Payer: Self-pay | Admitting: Radiology

## 2010-08-03 NOTE — Telephone Encounter (Signed)
FYI [facility] cancelled ifob, no sample received.

## 2010-08-04 NOTE — Telephone Encounter (Signed)
Noted  

## 2010-08-13 ENCOUNTER — Encounter: Payer: Self-pay | Admitting: Family Medicine

## 2010-08-16 ENCOUNTER — Other Ambulatory Visit: Payer: Self-pay | Admitting: Family Medicine

## 2010-09-04 ENCOUNTER — Other Ambulatory Visit: Payer: BC Managed Care – PPO

## 2010-09-09 ENCOUNTER — Ambulatory Visit: Payer: BC Managed Care – PPO | Admitting: Family Medicine

## 2010-11-14 ENCOUNTER — Other Ambulatory Visit: Payer: Self-pay | Admitting: Family Medicine

## 2010-11-16 NOTE — Telephone Encounter (Signed)
Patient needs to schedule follow-up appointment and lab work. Left message on machine to call back.

## 2010-11-26 ENCOUNTER — Other Ambulatory Visit: Payer: Self-pay | Admitting: Family Medicine

## 2010-11-26 DIAGNOSIS — I10 Essential (primary) hypertension: Secondary | ICD-10-CM

## 2010-11-26 DIAGNOSIS — K5730 Diverticulosis of large intestine without perforation or abscess without bleeding: Secondary | ICD-10-CM

## 2010-11-26 DIAGNOSIS — E785 Hyperlipidemia, unspecified: Secondary | ICD-10-CM

## 2010-11-27 ENCOUNTER — Other Ambulatory Visit (INDEPENDENT_AMBULATORY_CARE_PROVIDER_SITE_OTHER): Payer: BC Managed Care – PPO

## 2010-11-27 DIAGNOSIS — E785 Hyperlipidemia, unspecified: Secondary | ICD-10-CM

## 2010-11-27 DIAGNOSIS — K5730 Diverticulosis of large intestine without perforation or abscess without bleeding: Secondary | ICD-10-CM

## 2010-11-27 DIAGNOSIS — I10 Essential (primary) hypertension: Secondary | ICD-10-CM

## 2010-11-27 LAB — RENAL FUNCTION PANEL
Albumin: 4.5 g/dL (ref 3.5–5.2)
Calcium: 9.4 mg/dL (ref 8.4–10.5)
Creatinine, Ser: 1.2 mg/dL (ref 0.4–1.5)
Glucose, Bld: 91 mg/dL (ref 70–99)
Sodium: 140 mEq/L (ref 135–145)

## 2010-11-27 LAB — HEPATIC FUNCTION PANEL
ALT: 16 U/L (ref 0–53)
AST: 24 U/L (ref 0–37)
Albumin: 4.5 g/dL (ref 3.5–5.2)

## 2010-11-27 LAB — LDL CHOLESTEROL, DIRECT: Direct LDL: 176.8 mg/dL

## 2010-11-27 LAB — CBC WITH DIFFERENTIAL/PLATELET
Basophils Relative: 0.6 % (ref 0.0–3.0)
Eosinophils Relative: 2.2 % (ref 0.0–5.0)
HCT: 46.4 % (ref 39.0–52.0)
Lymphs Abs: 3.2 10*3/uL (ref 0.7–4.0)
MCHC: 33.5 g/dL (ref 30.0–36.0)
MCV: 86.7 fl (ref 78.0–100.0)
Monocytes Absolute: 0.7 10*3/uL (ref 0.1–1.0)
Platelets: 267 10*3/uL (ref 150.0–400.0)
WBC: 9.6 10*3/uL (ref 4.5–10.5)

## 2010-11-27 LAB — LIPID PANEL: Cholesterol: 235 mg/dL — ABNORMAL HIGH (ref 0–200)

## 2010-12-09 ENCOUNTER — Encounter: Payer: Self-pay | Admitting: Family Medicine

## 2010-12-09 ENCOUNTER — Ambulatory Visit (INDEPENDENT_AMBULATORY_CARE_PROVIDER_SITE_OTHER): Payer: BC Managed Care – PPO | Admitting: Family Medicine

## 2010-12-09 DIAGNOSIS — I10 Essential (primary) hypertension: Secondary | ICD-10-CM

## 2010-12-09 DIAGNOSIS — E785 Hyperlipidemia, unspecified: Secondary | ICD-10-CM

## 2010-12-09 MED ORDER — PRAVASTATIN SODIUM 40 MG PO TABS: 40.0000 mg | ORAL_TABLET | Freq: Every evening | ORAL | Status: DC

## 2010-12-09 MED ORDER — LISINOPRIL 5 MG PO TABS: 5.0000 mg | ORAL_TABLET | Freq: Every day | ORAL | Status: DC

## 2010-12-09 NOTE — Assessment & Plan Note (Signed)
Still high. Add Lisinopril in AM to Amlodipine that he is taking at night. BP Readings from Last 3 Encounters:  12/09/10 140/90  06/11/10 144/80  05/05/09 132/84

## 2010-12-09 NOTE — Patient Instructions (Signed)
RTC 3 mos with Dr Para March for BP and chol recheck, labs prior.

## 2010-12-09 NOTE — Progress Notes (Signed)
  Subjective:    Patient ID: Bryce Villanueva, male    DOB: April 30, 1950, 60 y.o.   MRN: [id number]  HPI Pt here for three month followup of BP and chol. He has had labs done prior. He feels well and has no complaints. He has tried hard to avoid fatty foods and salt.    Review of Systems  Constitutional: Negative for fever, chills, diaphoresis, activity change, appetite change and fatigue.  HENT: Negative for hearing loss, ear pain, congestion, sore throat, rhinorrhea, neck pain, neck stiffness, postnasal drip, sinus pressure, tinnitus and ear discharge.   Eyes: Negative for pain, discharge and visual disturbance.  Respiratory: Negative for cough, shortness of breath and wheezing.   Cardiovascular: Negative for chest pain and palpitations.       No SOB w/ exertion  Gastrointestinal:       No heartburn or swallowing problems.  Genitourinary:       No nocturia  Skin:       No itching or dryness.  Neurological:       No tingling or balance problems.  All other systems reviewed and are negative.       Objective:   Physical Exam  Constitutional: He appears well-developed and well-nourished. No distress.  HENT:  Head: Normocephalic and atraumatic.  Right Ear: External ear normal.  Left Ear: External ear normal.  Nose: Nose normal.  Mouth/Throat: Oropharynx is clear and moist.  Eyes: Conjunctivae and EOM are normal. Pupils are equal, round, and reactive to light. Right eye exhibits no discharge. Left eye exhibits no discharge.  Neck: Normal range of motion. Neck supple.  Cardiovascular: Normal rate and regular rhythm.   Pulmonary/Chest: Effort normal and breath sounds normal. He has no wheezes.  Lymphadenopathy:    He has no cervical adenopathy.  Skin: He is not diaphoretic.          Assessment & Plan:

## 2010-12-09 NOTE — Assessment & Plan Note (Signed)
LDL still too high. Start Prava 40. Recheck in 3 mos. Lab Results  Component Value Date   CHOL 235* 11/27/2010   HDL 41.60 11/27/2010   LDLDIRECT 176.8 11/27/2010   TRIG 143.0 11/27/2010   CHOLHDL 6 11/27/2010

## 2011-01-23 ENCOUNTER — Other Ambulatory Visit: Payer: Self-pay | Admitting: Family Medicine

## 2011-01-26 HISTORY — PX: SHOULDER SURGERY: SHX246

## 2011-01-27 ENCOUNTER — Telehealth: Payer: Self-pay | Admitting: Internal Medicine

## 2011-01-27 NOTE — Telephone Encounter (Signed)
Patient called and stated he is on Amlodipine 5mg  and Lisinipril 5 mg and the amlodipine makes him urinate every hour and he isn't getting any sleep.  The Lisinopril has given him a dry cough.  He did say that his bother went through the same thing and he is own Accupril now and it works great  He wanted to know if he could try Accupril and if you would write a Rx for this.

## 2011-01-28 ENCOUNTER — Other Ambulatory Visit: Payer: Self-pay | Admitting: *Deleted

## 2011-01-28 ENCOUNTER — Telehealth: Payer: Self-pay | Admitting: Internal Medicine

## 2011-01-28 MED ORDER — QUINAPRIL HCL 5 MG PO TABS: 5.0000 mg | ORAL_TABLET | Freq: Every day | ORAL | Status: DC

## 2011-01-28 NOTE — Telephone Encounter (Signed)
Patient advised.  Pt asked that a 30 day supply [facility] [place] [facility].

## 2011-01-28 NOTE — Telephone Encounter (Signed)
rx for accupril sent. Stop lisinopril.  If cough doesn't improve, then let us know.  O/w, well f/u BP at next OV.  Thanks.

## 2011-01-28 NOTE — Telephone Encounter (Signed)
Informed patient that Rx was sent in for Accupril and to stop the lisinopril.  Call if cough doesn't get better.

## 2011-01-28 NOTE — Telephone Encounter (Signed)
Patient advised to stop Lisinopril and start Accupril.  Rx was sent to [facility] but patient asked that a 30 day supply be [facility] [place] so that he can get started on them sooner.

## 2011-03-05 ENCOUNTER — Other Ambulatory Visit (INDEPENDENT_AMBULATORY_CARE_PROVIDER_SITE_OTHER): Payer: BC Managed Care – PPO

## 2011-03-05 DIAGNOSIS — E785 Hyperlipidemia, unspecified: Secondary | ICD-10-CM

## 2011-03-05 DIAGNOSIS — I10 Essential (primary) hypertension: Secondary | ICD-10-CM

## 2011-03-05 LAB — LDL CHOLESTEROL, DIRECT: Direct LDL: 164.1 mg/dL

## 2011-03-05 LAB — LIPID PANEL
Cholesterol: 217 mg/dL — ABNORMAL HIGH (ref 0–200)
VLDL: 20.8 mg/dL (ref 0.0–40.0)

## 2011-03-05 LAB — HEPATIC FUNCTION PANEL
ALT: 18 U/L (ref 0–53)
Total Bilirubin: 0.9 mg/dL (ref 0.3–1.2)

## 2011-03-05 LAB — RENAL FUNCTION PANEL
Calcium: 9.8 mg/dL (ref 8.4–10.5)
Creatinine, Ser: 1.4 mg/dL (ref 0.4–1.5)
GFR: 54.78 mL/min — ABNORMAL LOW (ref >60.00)
Glucose, Bld: 89 mg/dL (ref 70–99)
Phosphorus: 2.9 mg/dL (ref 2.3–4.6)
Sodium: 137 mEq/L (ref 135–145)

## 2011-03-12 ENCOUNTER — Encounter: Payer: Self-pay | Admitting: Family Medicine

## 2011-03-12 ENCOUNTER — Ambulatory Visit (INDEPENDENT_AMBULATORY_CARE_PROVIDER_SITE_OTHER): Payer: BC Managed Care – PPO | Admitting: Family Medicine

## 2011-03-12 VITALS — BP 126/84 | HR 52 | Temp 98.3°F | Wt 206.0 lb

## 2011-03-12 DIAGNOSIS — I10 Essential (primary) hypertension: Secondary | ICD-10-CM

## 2011-03-12 DIAGNOSIS — E785 Hyperlipidemia, unspecified: Secondary | ICD-10-CM

## 2011-03-12 NOTE — Progress Notes (Signed)
R shoulder pain per [facility] ortho.    Hypertension:   Using medication without problems or lightheadedness: slightly lightheaded last few days, he'll let me know if continued.   Chest pain with exertion: no Edema: no Short of breath: no Working on diet and losing weight.  Had a cough initially with this ACE but is improved.    Elevated Cholesterol: no meds, working on weight.  We talked about diet and weight.  He was intolerant of statin.   Meds, vitals, and allergies reviewed.   PMH and SH reviewed  ROS: See HPI.  Otherwise negative.    GEN: nad, alert and oriented HEENT: mucous membranes moist NECK: supple w/o LA CV: rrr. PULM: ctab, no inc wob ABD: soft, +bs EXT: no edema SKIN: no acute rash

## 2011-03-12 NOTE — Patient Instructions (Signed)
Keep working on your diet and losing weight.  I would recheck your labs in 6 months before a visit.  Call me if the lightheaded sensation continues.   Glad to see you.

## 2011-03-14 NOTE — Assessment & Plan Note (Signed)
Labs d/w pt, some improvement, continue weight loss, no new meds.  >25 min spent with face to face with patient, >50% counseling and/or coordinating care

## 2011-03-14 NOTE — Assessment & Plan Note (Signed)
Will current meds, no change in meds.  I think he'll adjust to the med and we'll follow BP, call back as needed.  He agrees. Continue work on Occupational hygienist.

## 2011-05-14 ENCOUNTER — Other Ambulatory Visit: Payer: Self-pay | Admitting: Family Medicine

## 2011-08-31 ENCOUNTER — Other Ambulatory Visit: Payer: Self-pay | Admitting: Orthopedic Surgery

## 2011-09-06 ENCOUNTER — Other Ambulatory Visit (INDEPENDENT_AMBULATORY_CARE_PROVIDER_SITE_OTHER): Payer: BC Managed Care – PPO

## 2011-09-06 DIAGNOSIS — I10 Essential (primary) hypertension: Secondary | ICD-10-CM

## 2011-09-06 LAB — LIPID PANEL
Cholesterol: 210 mg/dL — ABNORMAL HIGH (ref 0–200)
HDL: 47.1 mg/dL (ref >39.00)
Total CHOL/HDL Ratio: 4
Triglycerides: 146 mg/dL (ref 0.0–149.0)
VLDL: 29.2 mg/dL (ref 0.0–40.0)

## 2011-09-09 ENCOUNTER — Encounter: Payer: Self-pay | Admitting: Family Medicine

## 2011-09-09 ENCOUNTER — Ambulatory Visit (INDEPENDENT_AMBULATORY_CARE_PROVIDER_SITE_OTHER): Payer: BC Managed Care – PPO | Admitting: Family Medicine

## 2011-09-09 VITALS — BP 124/80 | HR 60 | Temp 97.6°F | Wt 175.0 lb

## 2011-09-09 DIAGNOSIS — E785 Hyperlipidemia, unspecified: Secondary | ICD-10-CM

## 2011-09-09 DIAGNOSIS — I10 Essential (primary) hypertension: Secondary | ICD-10-CM

## 2011-09-09 NOTE — Progress Notes (Signed)
Hypertension:    Having some light headedness Chest pain with exertion:no Edema:no Short of breath:no Down ~30 lbs with intentional diet and exercise.  Cut out sodas, is eating salads.   He's had some low BPs in the meantime.    Lipids d/w pt. Statin intolerant. Lipids improved.   Has f/u with ortho scheduled. No CP, SOB, BLE.  Exercise tolerance is great.  Walking 12-18000 steps a day. Swimming for exercise.   Meds, vitals, and allergies reviewed.   ROS: See HPI.  Otherwise negative.    GEN: nad, alert and oriented HEENT: mucous membranes moist NECK: supple w/o LA CV: rrr. PULM: ctab, no inc wob ABD: soft, +bs EXT: no edema SKIN: no acute rash

## 2011-09-09 NOTE — Patient Instructions (Addendum)
Stop the quinapril and keep an eye on your BP.  Take care.  Glad to see you.  I would get a physical next year.  Call with questions in the meantime.

## 2011-09-10 ENCOUNTER — Encounter: Payer: Self-pay | Admitting: Family Medicine

## 2011-09-10 NOTE — Assessment & Plan Note (Signed)
Improved, statin intolerant, recheck later in 2014 before a physical.  Continue diet and exercise.

## 2011-09-10 NOTE — Assessment & Plan Note (Signed)
Resolved, would stop meds.  Continue diet and exercise.  I thanked him for his effort.

## 2011-10-28 ENCOUNTER — Encounter (HOSPITAL_BASED_OUTPATIENT_CLINIC_OR_DEPARTMENT_OTHER): Payer: Self-pay | Admitting: *Deleted

## 2011-10-28 NOTE — Progress Notes (Signed)
NPO AFTER MN. ARRIVES AT 0600. NEEDS HG.  

## 2011-11-02 ENCOUNTER — Ambulatory Visit (HOSPITAL_BASED_OUTPATIENT_CLINIC_OR_DEPARTMENT_OTHER)
Admission: RE | Admit: 2011-11-02 | Discharge: 2011-11-02 | Disposition: A | Discharge status: Home / Self Care | Payer: BC Managed Care – PPO | Source: Ambulatory Visit | Attending: Orthopedic Surgery | Admitting: Orthopedic Surgery

## 2011-11-02 ENCOUNTER — Ambulatory Visit (HOSPITAL_BASED_OUTPATIENT_CLINIC_OR_DEPARTMENT_OTHER): Payer: BC Managed Care – PPO | Admitting: Anesthesiology

## 2011-11-02 ENCOUNTER — Encounter (HOSPITAL_BASED_OUTPATIENT_CLINIC_OR_DEPARTMENT_OTHER): Payer: Self-pay | Admitting: Anesthesiology

## 2011-11-02 ENCOUNTER — Encounter (HOSPITAL_BASED_OUTPATIENT_CLINIC_OR_DEPARTMENT_OTHER)
Admission: RE | Disposition: A | Discharge status: Home / Self Care | Payer: Self-pay | Source: Ambulatory Visit | Attending: Orthopedic Surgery

## 2011-11-02 ENCOUNTER — Encounter (HOSPITAL_BASED_OUTPATIENT_CLINIC_OR_DEPARTMENT_OTHER): Payer: Self-pay | Admitting: *Deleted

## 2011-11-02 DIAGNOSIS — M24119 Other articular cartilage disorders, unspecified shoulder: Secondary | ICD-10-CM | POA: Insufficient documentation

## 2011-11-02 DIAGNOSIS — S43429A Sprain of unspecified rotator cuff capsule, initial encounter: Secondary | ICD-10-CM | POA: Insufficient documentation

## 2011-11-02 DIAGNOSIS — M66329 Spontaneous rupture of flexor tendons, unspecified upper arm: Secondary | ICD-10-CM | POA: Insufficient documentation

## 2011-11-02 DIAGNOSIS — M75101 Unspecified rotator cuff tear or rupture of right shoulder, not specified as traumatic: Secondary | ICD-10-CM

## 2011-11-02 DIAGNOSIS — X58XXXA Exposure to other specified factors, initial encounter: Secondary | ICD-10-CM | POA: Insufficient documentation

## 2011-11-02 DIAGNOSIS — I10 Essential (primary) hypertension: Secondary | ICD-10-CM | POA: Insufficient documentation

## 2011-11-02 HISTORY — DX: Strain of muscle, fascia and tendon of other parts of biceps, unspecified arm, initial encounter: S46.219A

## 2011-11-02 HISTORY — DX: Superior glenoid labrum lesion of unspecified shoulder, initial encounter: S43.439A

## 2011-11-02 SURGERY — SHOULDER ARTHROSCOPY WITH SUBACROMIAL DECOMPRESSION AND OPEN ROTATOR CUFF REPAIR, OPEN BICEPS TENDON REPAIR
Anesthesia: General | Site: Shoulder | Laterality: Right | Wound class: Clean

## 2011-11-02 MED ORDER — MIDAZOLAM HCL 5 MG/5ML IJ SOLN
INTRAMUSCULAR | Status: DC | PRN
  Administered 2011-11-02: 1 mg via INTRAVENOUS

## 2011-11-02 MED ORDER — MIDAZOLAM HCL 2 MG/2ML IJ SOLN
2.0000 mg | Freq: Once | INTRAMUSCULAR | Status: AC
  Administered 2011-11-02: 2 mg via INTRAVENOUS

## 2011-11-02 MED ORDER — HEMOSTATIC AGENTS (NO CHARGE) OPTIME
TOPICAL | Status: DC | PRN
  Administered 2011-11-02: 1 via TOPICAL

## 2011-11-02 MED ORDER — ONDANSETRON HCL 4 MG/2ML IJ SOLN
INTRAMUSCULAR | Status: DC | PRN
  Administered 2011-11-02: 4 mg via INTRAVENOUS

## 2011-11-02 MED ORDER — HYDROMORPHONE HCL PF 1 MG/ML IJ SOLN
0.5000 mg | INTRAMUSCULAR | Status: DC
  Administered 2011-11-02 (×3): 0.5 mg via INTRAVENOUS

## 2011-11-02 MED ORDER — MEPERIDINE HCL 25 MG/ML IJ SOLN: 6.2500 mg | INTRAMUSCULAR | Status: DC | PRN

## 2011-11-02 MED ORDER — DEXAMETHASONE SODIUM PHOSPHATE 4 MG/ML IJ SOLN
INTRAMUSCULAR | Status: DC | PRN
  Administered 2011-11-02: 8 mg via INTRAVENOUS

## 2011-11-02 MED ORDER — ROPIVACAINE HCL 5 MG/ML IJ SOLN
INTRAMUSCULAR | Status: DC | PRN
  Administered 2011-11-02: 30 mL

## 2011-11-02 MED ORDER — SUCCINYLCHOLINE CHLORIDE 20 MG/ML IJ SOLN
INTRAMUSCULAR | Status: DC | PRN
  Administered 2011-11-02: 120 mg via INTRAVENOUS

## 2011-11-02 MED ORDER — CEFAZOLIN SODIUM-DEXTROSE 2-3 GM-% IV SOLR
2.0000 g | INTRAVENOUS | Status: AC
  Administered 2011-11-02: 2 g via INTRAVENOUS

## 2011-11-02 MED ORDER — LACTATED RINGERS IV SOLN: INTRAVENOUS | Status: DC

## 2011-11-02 MED ORDER — FENTANYL CITRATE 0.05 MG/ML IJ SOLN
100.0000 ug | Freq: Once | INTRAMUSCULAR | Status: AC
  Administered 2011-11-02: 100 ug via INTRAVENOUS

## 2011-11-02 MED ORDER — OXYCODONE-ACETAMINOPHEN 7.5-325 MG PO TABS: 1.0000 | ORAL_TABLET | ORAL | Status: DC | PRN

## 2011-11-02 MED ORDER — METHOCARBAMOL 100 MG/ML IJ SOLN: 500.0000 mg | Freq: Three times a day (TID) | INTRAVENOUS | Status: DC

## 2011-11-02 MED ORDER — LACTATED RINGERS IV SOLN
INTRAVENOUS | Status: DC
  Administered 2011-11-02 (×3): via INTRAVENOUS

## 2011-11-02 MED ORDER — POVIDONE-IODINE 7.5 % EX SOLN: Freq: Once | CUTANEOUS | Status: DC

## 2011-11-02 MED ORDER — BUPIVACAINE-EPINEPHRINE 0.5% -1:200000 IJ SOLN
INTRAMUSCULAR | Status: DC | PRN
  Administered 2011-11-02: 8 mL

## 2011-11-02 MED ORDER — SODIUM CHLORIDE 0.9 % IR SOLN
Status: DC | PRN
  Administered 2011-11-02: 08:00:00

## 2011-11-02 MED ORDER — LIDOCAINE HCL (CARDIAC) 20 MG/ML IV SOLN
INTRAVENOUS | Status: DC | PRN
  Administered 2011-11-02: 80 mg via INTRAVENOUS

## 2011-11-02 MED ORDER — PROMETHAZINE HCL 25 MG/ML IJ SOLN: 6.2500 mg | INTRAMUSCULAR | Status: DC | PRN

## 2011-11-02 MED ORDER — PROPOFOL 10 MG/ML IV BOLUS
INTRAVENOUS | Status: DC | PRN
  Administered 2011-11-02: 200 mg via INTRAVENOUS

## 2011-11-02 MED ORDER — OXYCODONE-ACETAMINOPHEN 5-325 MG PO TABS
1.0000 | ORAL_TABLET | Freq: Once | ORAL | Status: AC
  Administered 2011-11-02: 1 via ORAL

## 2011-11-02 MED ORDER — LIDOCAINE HCL 4 % MT SOLN
OROMUCOSAL | Status: DC | PRN
  Administered 2011-11-02: 4 mL via TOPICAL

## 2011-11-02 MED ORDER — OXYCODONE HCL 5 MG PO TABS
2.5000 mg | ORAL_TABLET | Freq: Once | ORAL | Status: AC
  Administered 2011-11-02: 2.5 mg via ORAL

## 2011-11-02 MED ORDER — METHOCARBAMOL 500 MG PO TABS: 500.0000 mg | ORAL_TABLET | Freq: Four times a day (QID) | ORAL | Status: DC

## 2011-11-02 MED ORDER — FENTANYL CITRATE 0.05 MG/ML IJ SOLN
INTRAMUSCULAR | Status: DC | PRN
  Administered 2011-11-02 (×2): 50 ug via INTRAVENOUS

## 2011-11-02 MED ORDER — EPHEDRINE SULFATE 50 MG/ML IJ SOLN
INTRAMUSCULAR | Status: DC | PRN
  Administered 2011-11-02 (×2): 10 mg via INTRAVENOUS

## 2011-11-02 MED ORDER — METHOCARBAMOL 500 MG PO TABS
500.0000 mg | ORAL_TABLET | Freq: Once | ORAL | Status: AC
  Administered 2011-11-02: 500 mg via ORAL

## 2011-11-02 MED ORDER — FENTANYL CITRATE 0.05 MG/ML IJ SOLN
25.0000 ug | INTRAMUSCULAR | Status: DC | PRN
  Administered 2011-11-02 (×2): 25 ug via INTRAVENOUS

## 2011-11-02 SURGICAL SUPPLY — 82 items
ANCH SUT 2 5.5 BABSR ASCP (Orthopedic Implant) ×2 IMPLANT
ANCHOR PEEK ZIP 5.5 NDL NO2 (Orthopedic Implant) ×2 IMPLANT
APL SKNCLS STERI-STRIP NONHPOA (GAUZE/BANDAGES/DRESSINGS)
BENZOIN TINCTURE PRP APPL 2/3 (GAUZE/BANDAGES/DRESSINGS) IMPLANT
BLADE 4.2CUDA (BLADE) ×2 IMPLANT
BLADE AVERAGE 25X9 (BLADE) ×1 IMPLANT
BLADE CUDA 4.2 (BLADE) IMPLANT
BLADE CUDA 5.5 (BLADE) IMPLANT
BLADE CUDA SHAVER 3.5 (BLADE) IMPLANT
BLADE CUTTER GATOR 3.5 (BLADE) IMPLANT
BLADE FLAT COURSE (BLADE) IMPLANT
BLADE GREAT WHITE 4.2 (BLADE) IMPLANT
BLADE SURG 10 STRL SS (BLADE) ×1 IMPLANT
BLADE SURG 15 STRL LF DISP TIS (BLADE) IMPLANT
BLADE SURG 15 STRL SS (BLADE) ×2
BUR OVAL 4.0 (BURR) ×2 IMPLANT
CANISTER SUCT LVC 12 LTR MEDI- (MISCELLANEOUS) ×4 IMPLANT
CANISTER SUCTION 1200CC (MISCELLANEOUS) ×2 IMPLANT
CLOTH BEACON ORANGE TIMEOUT ST (SAFETY) ×2 IMPLANT
DRAPE INCISE IOBAN 66X45 STRL (DRAPES) ×1 IMPLANT
DRAPE LG THREE QUARTER DISP (DRAPES) ×4 IMPLANT
DRAPE SHOULDER BEACH CHAIR (DRAPES) ×2 IMPLANT
DRAPE U-SHAPE 47X51 STRL (DRAPES) ×2 IMPLANT
DRSG ADAPTIC 3X8 NADH LF (GAUZE/BANDAGES/DRESSINGS) ×2 IMPLANT
DRSG PAD ABDOMINAL 8X10 ST (GAUZE/BANDAGES/DRESSINGS) ×2 IMPLANT
DURAPREP 26ML APPLICATOR (WOUND CARE) ×2 IMPLANT
ELECT MENISCUS 165MM 90D (ELECTRODE) IMPLANT
ELECT REM PT RETURN 9FT ADLT (ELECTROSURGICAL) ×2
ELECTRODE REM PT RTRN 9FT ADLT (ELECTROSURGICAL) ×1 IMPLANT
GLOVE BIO SURGEON STRL SZ 6.5 (GLOVE) ×1 IMPLANT
GLOVE BIO SURGEON STRL SZ7 (GLOVE) ×1 IMPLANT
GLOVE BIOGEL PI IND STRL 7.5 (GLOVE) IMPLANT
GLOVE BIOGEL PI IND STRL 8 (GLOVE) ×1 IMPLANT
GLOVE BIOGEL PI INDICATOR 7.5 (GLOVE) ×1
GLOVE BIOGEL PI INDICATOR 8 (GLOVE) ×1
GLOVE ECLIPSE 6.0 STRL STRAW (GLOVE) ×1 IMPLANT
GLOVE ECLIPSE 7.0 STRL STRAW (GLOVE) ×1 IMPLANT
GLOVE ECLIPSE 8.0 STRL XLNG CF (GLOVE) ×3 IMPLANT
GLOVE INDICATOR 7.0 STRL GRN (GLOVE) ×1 IMPLANT
GLOVE INDICATOR 8.0 STRL GRN (GLOVE) ×4 IMPLANT
GOWN PREVENTION PLUS LG XLONG (DISPOSABLE) ×5 IMPLANT
GOWN STRL REIN XL XLG (GOWN DISPOSABLE) ×4 IMPLANT
IV NS IRRIG 3000ML ARTHROMATIC (IV SOLUTION) ×4 IMPLANT
NDL 1/2 CIR CATGUT .05X1.09 (NEEDLE) IMPLANT
NDL HYPO 18GX1.5 BLUNT FILL (NEEDLE) ×1 IMPLANT
NDL SAFETY ECLIPSE 18X1.5 (NEEDLE) ×1 IMPLANT
NEEDLE 1/2 CIR CATGUT .05X1.09 (NEEDLE) IMPLANT
NEEDLE HYPO 18GX1.5 BLUNT FILL (NEEDLE) ×2 IMPLANT
NEEDLE HYPO 18GX1.5 SHARP (NEEDLE) ×2
NEEDLE HYPO 22GX1.5 SAFETY (NEEDLE) ×2 IMPLANT
NS IRRIG 500ML POUR BTL (IV SOLUTION) ×2 IMPLANT
PACK ARTHROSCOPY DSU (CUSTOM PROCEDURE TRAY) ×2 IMPLANT
PACK BASIN DAY SURGERY FS (CUSTOM PROCEDURE TRAY) ×2 IMPLANT
PATCH TISSUE MEND 3X3CM (Orthopedic Implant) ×1 IMPLANT
PENCIL BUTTON HOLSTER BLD 10FT (ELECTRODE) IMPLANT
SET ARTHROSCOPY TUBING (MISCELLANEOUS) ×2
SET ARTHROSCOPY TUBING LN (MISCELLANEOUS) ×1 IMPLANT
SLING ARM IMMOBILIZER LRG (SOFTGOODS) IMPLANT
SLING ULTRA II L (ORTHOPEDIC SUPPLIES) ×1 IMPLANT
SPONGE GAUZE 4X4 12PLY (GAUZE/BANDAGES/DRESSINGS) ×2 IMPLANT
SPONGE LAP 4X18 X RAY DECT (DISPOSABLE) ×1 IMPLANT
SPONGE SURGIFOAM ABS GEL 100 (HEMOSTASIS) ×1 IMPLANT
STAPLER VISISTAT 35W (STAPLE) IMPLANT
STRIP CLOSURE SKIN 1/2X4 (GAUZE/BANDAGES/DRESSINGS) IMPLANT
SUCTION FRAZIER TIP 10 FR DISP (SUCTIONS) ×2 IMPLANT
SUT BONE WAX W31G (SUTURE) ×2 IMPLANT
SUT ETHIBOND GREEN BRAID 0S 4 (SUTURE) IMPLANT
SUT ETHIBOND NAB CT1 #1 30IN (SUTURE) IMPLANT
SUT ETHILON 4 0 PS 2 18 (SUTURE) ×4 IMPLANT
SUT VIC AB 0 CT1 36 (SUTURE) ×1 IMPLANT
SUT VIC AB 1 CT1 36 (SUTURE) ×2 IMPLANT
SUT VIC AB 2-0 CT1 27 (SUTURE) ×2
SUT VIC AB 2-0 CT1 TAPERPNT 27 (SUTURE) ×1 IMPLANT
SUT VIC AB 3-0 SH 27 (SUTURE)
SUT VIC AB 3-0 SH 27X BRD (SUTURE) IMPLANT
SYR BULB IRRIGATION 50ML (SYRINGE) ×2 IMPLANT
SYRINGE 10CC LL (SYRINGE) ×2 IMPLANT
TAPE HYPAFIX 6X30 (GAUZE/BANDAGES/DRESSINGS) ×1 IMPLANT
TOWEL OR 17X24 6PK STRL BLUE (TOWEL DISPOSABLE) ×3 IMPLANT
TUBE CONNECTING 12X1/4 (SUCTIONS) ×3 IMPLANT
WAND 90 DEG TURBOVAC W/CORD (SURGICAL WAND) ×2 IMPLANT
WATER STERILE IRR 500ML POUR (IV SOLUTION) ×2 IMPLANT

## 2011-11-02 NOTE — Anesthesia Postprocedure Evaluation (Signed)
  Immediate Anesthesia Transfer of Care Note  Patient: Bryce Villanueva  Procedure(s) Performed: Procedure(s) (LRB): SHOULDER ARTHROSCOPY WITH SUBACROMIAL DECOMPRESSION AND OPEN ROTATOR CUFF REPAIR, OPEN BICEP TENDON REPAIR (Right)  Patient Location: PACU  Anesthesia Type: General  Level of Consciousness: awake, alert  and oriented  Airway & Oxygen Therapy: Patient Spontanous Breathing and Patient connected to face mask oxygen  Post-op Assessment: Report given to PACU RN and Post -op Vital signs reviewed and stable  Post vital signs: Reviewed and stable  Complications: No apparent anesthesia complications

## 2011-11-02 NOTE — H&P (Signed)
Bryce Villanueva is an 61 y.o. male.   Chief Complaint: painful rt shpulder HPI: MRI demonstrates extensive rotator cuff tear with tear ol labrum and fraying of biceps tendon  Past Medical History  Diagnosis Date  . Diverticulosis of colon   . Labral tear of shoulder   . Biceps tendon tear RIGHT ARM  . History of hypertension RESOLVED 2013 PER PCP DUE TO WT. LOSS-- NO MEDS      Past Surgical History  Procedure Date  . Foot surgery 03/05/09    RIGHT ANKLE  BONE SPUR REMOVAL (Dr. Lestine Box)    Family History  Problem Relation Age of Onset  . Emphysema Father     smoker  . Alcohol abuse Father   . Hypertension Brother   . Alcohol abuse Brother   . Hypertension Son   . Hypertension Brother   . Prostate cancer Neg Hx   . Colon cancer Neg Hx    Social History:  reports that he has never smoked. He has never used smokeless tobacco. He reports that he drinks about .5 ounces of alcohol per week. He reports that he does not use illicit drugs.  Allergies:  Allergies  Allergen Reactions  . Amlodipine     Frequent urination  . Lisinopril     Dry cough but tolerates quinapril  . Pravastatin     GI upset, intolerant    Medications Prior to Admission  Medication Sig Dispense Refill  . acetaminophen (TYLENOL) 500 MG tablet Take 500 mg by mouth every 6 (six) hours as needed.        No results found for this or any previous visit (from the past 48 hour(s)). No results found.  ROS  Blood pressure 175/95, pulse 58, temperature 97 F (36.1 C), temperature source Oral, resp. rate 18, height 5\' 9"  (1.753 m), weight 79.068 kg (174 lb 5 oz), SpO2 98.00%. Physical Exam  Constitutional: He is oriented to person, place, and time. He appears well-developed and well-nourished.  HENT:  Head: Normocephalic and atraumatic.  Right Ear: External ear normal.  Left Ear: External ear normal.  Eyes: Conjunctivae normal and EOM are normal. Pupils are equal, round, and reactive to light.  Neck:  Normal range of motion. Neck supple.  Cardiovascular: Normal rate, regular rhythm, normal heart sounds and intact distal pulses.   Respiratory: Effort normal and breath sounds normal.  GI: Soft. Bowel sounds are normal.  Musculoskeletal: Normal range of motion. He exhibits tenderness.       Tender rotator cuff rt shoulder  Neurological: He is alert and oriented to person, place, and time. He has normal reflexes.  Skin: Skin is warm and dry.  Psychiatric: He has a normal mood and affect. His behavior is normal. Judgment and thought content normal.     Assessment/Plan Rt shoulder--extensive rotator cuff tear with labral tear and fraying of biceps tendon Rt shoulder arthroscopy with debridement of labrum and biceps tendon;open anterior acromionectomy and rotator cuff repair  APLINGTON,JAMES P 11/02/2011, 7:24 AM

## 2011-11-02 NOTE — Anesthesia Procedure Notes (Addendum)
Procedure Name: Intubation Date/Time: 11/02/2011 7:49 AM Performed by: Norva Pavlov Pre-anesthesia Checklist: Patient identified, Emergency Drugs available, Suction available and Patient being monitored Patient Re-evaluated:Patient Re-evaluated prior to inductionOxygen Delivery Method: Circle System Utilized Preoxygenation: Pre-oxygenation with 100% oxygen Intubation Type: IV induction Ventilation: Mask ventilation without difficulty Laryngoscope Size: Mac and 4 Grade View: Grade I Tube type: Oral Tube size: 8.0 mm Number of attempts: 1 Airway Equipment and Method: stylet,  oral airway and LTA kit utilized Placement Confirmation: ETT inserted through vocal cords under direct vision,  positive ETCO2 and breath sounds checked- equal and bilateral Secured at: 22 cm Tube secured with: Tape Dental Injury: Teeth and Oropharynx as per pre-operative assessment    Anesthesia Regional Block:  Supraclavicular block  Pre-Anesthetic Checklist: ,, timeout performed, Correct Patient, Correct Site, Correct Laterality, Correct Procedure, Correct Position, site marked, Risks and benefits discussed,  Surgical consent,  Pre-op evaluation,  At surgeon's request and post-op pain management  Laterality: Right and Upper  Prep: chloraprep       Needles:  Injection technique: Single-shot  Needle Type: Stimiplex     Needle Length: 10cm 10 cm Needle Gauge: 21 G    Additional Needles:  Procedures: ultrasound guided and nerve stimulator Supraclavicular block Narrative:  Injection made incrementally with aspirations every 5 mL.  Performed by: Personally  Anesthesiologist: Phillips Grout MD  Additional Notes: Risks, benefits and alternative to block explained extensively.  Patient tolerated procedure well, without complications.  Supraclavicular block

## 2011-11-02 NOTE — Anesthesia Preprocedure Evaluation (Addendum)

## 2011-11-02 NOTE — Brief Op Note (Signed)
11/02/2011  12:18 PM  PATIENT:  Bryce Villanueva  61 y.o. male  PRE-OPERATIVE DIAGNOSIS:  RIGHT SHOULDER LABRIAL TEAR AND PARTIAL BICEP TENDON TTEAR  POST-OPERATIVE DIAGNOSIS:  RIGHT SHOULDER LABRIAL TEAR AND PARTIAL BICEP   PROCEDURE:  Procedure(s) (LRB) with comments: SHOULDER ARTHROSCOPY WITH SUBACROMIAL DECOMPRESSION AND OPEN ROTATOR CUFF REPAIR, OPEN BICEP TENDON REPAIR (Right) - RIGHT SHOULDER ARTHROSCOPY WITH DEBRIDEMENT OF LABRIUM AND BICEP TENDON AND OPEN ANTERIOR ACROMINECTOMY AND ROTATOR CUFF REPAIR WITH  TISSUE MEND GRAFT GENERAL ANESTHESIA WITH BLOCK ALLEN OF SHENE FRAME OWER TO MAIN   SURGEON:  Surgeon(s) and Role:    * Drucilla Schmidt, MD - Primary  PHYSICIAN ASSISTANT:   ASSISTANTS:Blair Roberts PAC  ANESTHESIA:   regional and general  EBL:  Total I/O In: 1540 [P.O.:240; I.V.:1300] Out: -   BLOOD ADMINISTERED:none  DRAINS: none   LOCAL MEDICATIONS USED:  NONE  SPECIMEN:  No Specimen  DISPOSITION OF SPECIMEN:  N/A  COUNTS:  YES  TOURNIQUET:  * No tourniquets in log *  DICTATION: .Other Dictation: Dictation Number [id number]  PLAN OF CARE: Discharge to home after PACU  PATIENT DISPOSITION:  PACU - hemodynamically stable.   Delay start of Pharmacological VTE agent (>24hrs) due to surgical blood loss or risk of bleeding: yes

## 2011-11-02 NOTE — Anesthesia Postprocedure Evaluation (Signed)
  Anesthesia Post-op Note  Patient: Bryce Villanueva  Procedure(s) Performed: Procedure(s) (LRB): SHOULDER ARTHROSCOPY WITH SUBACROMIAL DECOMPRESSION AND OPEN ROTATOR CUFF REPAIR, OPEN BICEP TENDON REPAIR (Right)  Patient Location: PACU  Anesthesia Type: GA combined with regional for post-op pain  Level of Consciousness: awake and alert   Airway and Oxygen Therapy: Patient Spontanous Breathing  Post-op Pain: mild  Post-op Assessment: Post-op Vital signs reviewed, Patient's Cardiovascular Status Stable, Respiratory Function Stable, Patent Airway and No signs of Nausea or vomiting  Post-op Vital Signs: stable  Complications: No apparent anesthesia complications

## 2011-11-03 NOTE — Op Note (Signed)
NAMEJORDAN, Bryce Villanueva NO.:  [id number]  MEDICAL RECORD NO.:  [id number]  LOCATION:                               FACILITY:  [facility]  PHYSICIAN:  Marlowe Kays, M.D.  DATE OF BIRTH:  14-Jun-1950  DATE OF PROCEDURE:  11/02/2011 DATE OF DISCHARGE:  11/02/2011                              OPERATIVE REPORT   PREOPERATIVE DIAGNOSES: 1. Labral tear. 2. Partial tear of long-head biceps tendon. 3. Complete rotator cuff tear with retraction, right shoulder.  POSTOPERATIVE DIAGNOSES: 1. Labral tear. 2. Partial tear of long-head biceps tendon. 3. Complete rotator cuff tear with retraction, right shoulder.  OPERATION: 1. Right shoulder arthroscopy with debridement of frayed long-head     biceps tendon and debridement of labrum. 2. Open anterior acromionectomy with repair of torn rotator cuff     utilizing TissueMend graft.  SURGEON:  Marlowe Kays, MD  ASSISTANT:  Laural Benes. Su Hilt, PA-C  ANESTHESIA:  General preceded by interscalene block.  PROCEDURE:  Prophylactic antibiotics.  Ms. Su Hilt' assistance was required because the complex of the operation to help pull the arm and instrumentation.  He was placed on the sliding frame and the right shoulder girdle was prepped with DuraPrep, draped in sterile field. Arthroscopic anatomy was marked out on the shoulder and time-out performed through posterior soft spot.  After prepping the right shoulder girdle with DuraPrep and draped in sterile field, time-out was performed through posterior soft spot portal, atraumatically entered the glenohumeral joint.  There was evidence of prior bleeding due to hemosiderin present.  The fraying of the biceps tendon intra-articularly was not felt to require shaving, but at the attachment to the labrum, it did and I debrided this down with 4.2 shaver.  After retracting the scope between the biceps tendon, subscapularis and using a switching stick, over which, I placed a metal cannula  and then enter the 4.2 shaver into the joint.  I also debrided down the labrum after picturing it.  Pre and post films were taken.  I then made an anterior rotator cuff incision and with subperiosteal dissection using cutting cautery. I demarcated the anterior acromion.  I marked out the projected line for anterior acromionectomy using microsaw and protecting the underlying structures with a Cobb elevator, made my initial anterior acromionectomy and filed this with additional decompression.  On inspection of the joint, all I could see initially was a bald humeral head.  The biceps tendon was identified as being intact.  After retrieving the ends of the rotator cuff, I brought it lateralward holding with some hemostats, it was very thinned out.  I roughened up the greater tuberosity area with a rongeur and curette, and then used two 4-strand Striker anchors to stabilize the rotator cuff, but I had to do this to bring the cuff back in its anatomic position using the arm in abduction on a Mayo stand.  I then because of the thinness of the rotator cuff, supplemented the repair with a TissueMend graft, which I tacked down with 3-0 Vicryl supplemented by Gelfoam on top to help to kneel.  The wound was then irrigated with sterile saline and closure performed with interrupted #1 Vicryl and  the deltoid muscle slipped in the fascia over the anterior acromion, 2-0 Vicryl, in the subcutaneous tissue, staples in the skin. Betadine, Adaptic, and dry sterile dressing were applied.  His arm was then placed in an abducted position and a shoulder immobilizer, was taken to the recovery room in satisfactory condition with no known complications.          ______________________________ Marlowe Kays, M.D.     JA/MEDQ  D:  11/02/2011  T:  11/03/2011  Job:  [id number]

## 2012-01-25 ENCOUNTER — Other Ambulatory Visit: Payer: Self-pay | Admitting: Family Medicine

## 2012-01-25 NOTE — Telephone Encounter (Signed)
Pt requesting fluticasone refill but this medication is not on med list.  Last filled in April 2013.  Please advise.

## 2012-01-26 MED ORDER — FLUTICASONE PROPIONATE 50 MCG/ACT NA SUSP: 2.0000 | Freq: Every day | NASAL | Status: DC

## 2012-01-26 NOTE — Telephone Encounter (Signed)
Sent!

## 2012-01-27 ENCOUNTER — Other Ambulatory Visit: Payer: Self-pay | Admitting: Family Medicine

## 2012-03-11 ENCOUNTER — Other Ambulatory Visit: Payer: Self-pay

## 2012-03-28 ENCOUNTER — Other Ambulatory Visit: Payer: Self-pay | Admitting: Family Medicine

## 2012-03-28 NOTE — Telephone Encounter (Signed)
Electronic refill request.  Med not on current meds list.  Please advise.

## 2012-03-29 NOTE — Telephone Encounter (Signed)
Med was stopped prev.

## 2012-05-18 ENCOUNTER — Encounter: Payer: Self-pay | Admitting: Family Medicine

## 2012-05-18 ENCOUNTER — Ambulatory Visit (INDEPENDENT_AMBULATORY_CARE_PROVIDER_SITE_OTHER): Payer: BC Managed Care – PPO | Admitting: Family Medicine

## 2012-05-18 VITALS — BP 140/80 | HR 58 | Temp 98.1°F | Wt 183.5 lb

## 2012-05-18 DIAGNOSIS — J309 Allergic rhinitis, unspecified: Secondary | ICD-10-CM

## 2012-05-18 DIAGNOSIS — I10 Essential (primary) hypertension: Secondary | ICD-10-CM

## 2012-05-18 DIAGNOSIS — B009 Herpesviral infection, unspecified: Secondary | ICD-10-CM

## 2012-05-18 MED ORDER — FLUTICASONE PROPIONATE 50 MCG/ACT NA SUSP: 2.0000 | Freq: Every day | NASAL | Status: DC

## 2012-05-18 MED ORDER — QUINAPRIL HCL 5 MG PO TABS: 5.0000 mg | ORAL_TABLET | Freq: Every day | ORAL | Status: DC
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
## 2012-05-18 NOTE — Patient Instructions (Addendum)
Don't change your meds for now.  Schedule a physical for the summer of 2014.  Labs ahead of time.  Take care.

## 2012-05-19 DIAGNOSIS — J309 Allergic rhinitis, unspecified: Secondary | ICD-10-CM | POA: Insufficient documentation

## 2012-05-19 DIAGNOSIS — B009 Herpesviral infection, unspecified: Secondary | ICD-10-CM | POA: Insufficient documentation

## 2012-05-19 DIAGNOSIS — I10 Essential (primary) hypertension: Secondary | ICD-10-CM | POA: Insufficient documentation

## 2012-05-19 NOTE — Assessment & Plan Note (Signed)
Continue the famvir prn. I doubt the flonase is causing the flares, more likely typical fluctuation/recent stressors.

## 2012-05-19 NOTE — Assessment & Plan Note (Signed)
Continue ACE and return for CPE later in 2014. I offered my condolences re: death of this brother, etc.

## 2012-05-19 NOTE — Assessment & Plan Note (Signed)
flonase helps a lot for the patient, so I would continue.  Discussed, he agrees.

## 2012-06-05 ENCOUNTER — Encounter: Payer: Self-pay | Admitting: Family Medicine

## 2012-06-05 ENCOUNTER — Ambulatory Visit (INDEPENDENT_AMBULATORY_CARE_PROVIDER_SITE_OTHER): Payer: BC Managed Care – PPO | Admitting: Family Medicine

## 2012-06-05 VITALS — BP 134/84 | HR 66 | Temp 99.2°F | Wt 184.5 lb

## 2012-06-05 DIAGNOSIS — R059 Cough, unspecified: Secondary | ICD-10-CM

## 2012-06-05 DIAGNOSIS — R05 Cough: Secondary | ICD-10-CM

## 2012-06-05 MED ORDER — ALBUTEROL SULFATE HFA 108 (90 BASE) MCG/ACT IN AERS: 1.0000 | INHALATION_SPRAY | RESPIRATORY_TRACT | Status: DC | PRN

## 2012-06-05 NOTE — Assessment & Plan Note (Signed)
Likely viral, nontoxic.  Would use SABA prn for cough now along with routine supportive measures.  He doesn't need imaging or abx at this time.  ddx d/w pt.  He agrees.  Fu prn.

## 2012-06-05 NOTE — Progress Notes (Signed)
Sx started with a cough over the weekend.  More fatigued.  "I wanted to lay around and that isn't me."  Cough is productive, green sputum. Sleep is disrupted.  Robitussin doesn't help.  Elevating his head helps some.  Coughing causes a HA.  He has some sinus pressure.  The cough if the most bothersome part for him.  No fevers.  "I just don't feel good."  He called out for work.    Meds, vitals, and allergies reviewed.   ROS: See HPI.  Otherwise, noncontributory.  GEN: nad, alert and oriented HEENT: mucous membranes moist, tm w/o erythema, nasal exam w/o erythema, scant clear discharge noted,  OP with cobblestoning NECK: supple w/o LA CV: rrr.   PULM: ctab, no inc wob, dry cough noted, dec in cough after use of SABA in clinic.  EXT: no edema SKIN: no acute rash

## 2012-06-05 NOTE — Patient Instructions (Addendum)
Get some rest, drink plenty of fluids, take tylenol as needed and use the inhaler for the cough.  This should gradually improve.  If not better, then let us know. Glad to see you.

## 2012-06-16 ENCOUNTER — Ambulatory Visit (INDEPENDENT_AMBULATORY_CARE_PROVIDER_SITE_OTHER): Payer: Worker's Compensation | Admitting: Family Medicine

## 2012-06-16 ENCOUNTER — Ambulatory Visit (INDEPENDENT_AMBULATORY_CARE_PROVIDER_SITE_OTHER)
Admission: RE | Admit: 2012-06-16 | Discharge: 2012-06-16 | Disposition: A | Discharge status: Home / Self Care | Payer: Worker's Compensation | Source: Ambulatory Visit | Attending: Family Medicine | Admitting: Family Medicine

## 2012-06-16 ENCOUNTER — Encounter: Payer: Self-pay | Admitting: Family Medicine

## 2012-06-16 VITALS — BP 120/80 | HR 54 | Temp 98.1°F | Wt 185.5 lb

## 2012-06-16 DIAGNOSIS — M25571 Pain in right ankle and joints of right foot: Secondary | ICD-10-CM

## 2012-06-16 DIAGNOSIS — M25579 Pain in unspecified ankle and joints of unspecified foot: Secondary | ICD-10-CM

## 2012-06-16 NOTE — Progress Notes (Signed)
On erythromycin for an upcoming dental procedure.    He had a desktop computer fall onto his R foot yesterday.  Immediate pain.  Pain with weight bearing.  Bruised and puffy on the dorsum of the R foot.   Meds, vitals, and allergies reviewed.   ROS: See HPI.  Otherwise, noncontributory.  nad No acute changes to the R ankle.   Bruised and puffy on the dorsum of the R foot, at the distal 3rd and 4th MT. Distally nv intact.  Normal BP pulse.  Can weight bear but with some discomfort.

## 2012-06-16 NOTE — Patient Instructions (Addendum)
We'll contact you with your xray report. Ice your foot, take advil with food as needed, and wear a sandal.   If not improving then let us know.  Take care.

## 2012-06-16 NOTE — Assessment & Plan Note (Signed)
No fx on my read of xray.  Await overread.  Supportive measures o/w, see instructions.  Call back prn. He agrees.

## 2012-08-28 ENCOUNTER — Other Ambulatory Visit: Payer: BC Managed Care – PPO

## 2012-09-01 ENCOUNTER — Encounter: Payer: BC Managed Care – PPO | Admitting: Family Medicine

## 2012-11-28 ENCOUNTER — Other Ambulatory Visit: Payer: Self-pay | Admitting: Family Medicine

## 2012-11-28 DIAGNOSIS — E785 Hyperlipidemia, unspecified: Secondary | ICD-10-CM

## 2012-11-30 ENCOUNTER — Other Ambulatory Visit: Payer: Self-pay

## 2012-12-04 ENCOUNTER — Other Ambulatory Visit (INDEPENDENT_AMBULATORY_CARE_PROVIDER_SITE_OTHER): Payer: BC Managed Care – PPO

## 2012-12-04 DIAGNOSIS — I10 Essential (primary) hypertension: Secondary | ICD-10-CM

## 2012-12-04 DIAGNOSIS — E785 Hyperlipidemia, unspecified: Secondary | ICD-10-CM

## 2012-12-04 LAB — COMPREHENSIVE METABOLIC PANEL
ALT: 14 U/L (ref 0–53)
AST: 20 U/L (ref 0–37)
Albumin: 4.5 g/dL (ref 3.5–5.2)
CO2: 28 mEq/L (ref 19–32)
Calcium: 9.5 mg/dL (ref 8.4–10.5)
Chloride: 104 mEq/L (ref 96–112)
GFR: 60.33 mL/min (ref >60.00)
Potassium: 4 mEq/L (ref 3.5–5.1)

## 2012-12-04 LAB — LDL CHOLESTEROL, DIRECT: Direct LDL: 187.8 mg/dL

## 2012-12-08 ENCOUNTER — Ambulatory Visit (INDEPENDENT_AMBULATORY_CARE_PROVIDER_SITE_OTHER): Payer: BC Managed Care – PPO | Admitting: Family Medicine

## 2012-12-08 ENCOUNTER — Encounter: Payer: Self-pay | Admitting: Family Medicine

## 2012-12-08 VITALS — BP 142/84 | HR 80 | Temp 97.9°F | Ht 69.0 in | Wt 193.5 lb

## 2012-12-08 DIAGNOSIS — E785 Hyperlipidemia, unspecified: Secondary | ICD-10-CM

## 2012-12-08 DIAGNOSIS — Z1211 Encounter for screening for malignant neoplasm of colon: Secondary | ICD-10-CM

## 2012-12-08 DIAGNOSIS — I10 Essential (primary) hypertension: Secondary | ICD-10-CM

## 2012-12-08 DIAGNOSIS — Z0000 Encounter for general adult medical examination without abnormal findings: Secondary | ICD-10-CM

## 2012-12-08 DIAGNOSIS — J309 Allergic rhinitis, unspecified: Secondary | ICD-10-CM

## 2012-12-08 MED ORDER — GUAIFENESIN ER 600 MG PO TB12: 600.0000 mg | ORAL_TABLET | Freq: Two times a day (BID) | ORAL | Status: DC | PRN

## 2012-12-08 MED ORDER — QUINAPRIL HCL 5 MG PO TABS: 5.0000 mg | ORAL_TABLET | Freq: Every day | ORAL | Status: DC

## 2012-12-08 NOTE — Assessment & Plan Note (Signed)
Routine anticipatory guidance given to patient.  See health maintenance. Tetanus 2007 Flu shot done.  Shingles shot encouraged.  PNA shot due at 67 Prostate cancer screening and PSA options (with potential risks and benefits of testing vs not testing) were discussed along with recent recs/guidelines.  He declined testing PSA at this point. D/w patient [id number] for colon cancer screening, including IFOB vs. colonoscopy.  Risks and benefits of both were discussed and patient voiced understanding.  Pt elects BMW:UXLK.  Living will encouraged.  He's working on that.  Wife would be designated if incapacitated.   Diet and exercise- he's going to work on both.

## 2012-12-08 NOTE — Assessment & Plan Note (Signed)
He'll work more on diet and exercise.  Labs discussed.

## 2012-12-08 NOTE — Patient Instructions (Addendum)
Go to the lab on the way out.  We'll contact you with your lab report. Check with your insurance to see if they will cover the shingles shot. Take care.  Keep working on diet and exercise.   Recheck in about 1 year.

## 2012-12-08 NOTE — Assessment & Plan Note (Signed)
Controlled out of clinic, continue as is. The cough doesn't appear to be from the ACE.

## 2012-12-08 NOTE — Progress Notes (Signed)
Pre-visit discussion using our clinic review tool. No additional management support is needed unless otherwise documented below in the visit note.  CPE- See plan.  Routine anticipatory guidance given to patient.  See health maintenance. Tetanus 2007 Flu shot done.  Shingles shot encouraged.  PNA shot due at 67 Prostate cancer screening and PSA options (with potential risks and benefits of testing vs not testing) were discussed along with recent recs/guidelines.  He declined testing PSA at this point. D/w patient [id number] for colon cancer screening, including IFOB vs. colonoscopy.  Risks and benefits of both were discussed and patient voiced understanding.  Pt elects QMV:HQIO.  Living will encouraged.  He's working on that.  Wife would be designated if incapacitated.   Diet and exercise- he's going to work on both.    Cough at night, some clear sputum.  Taking zyrtec, recent start. Asking about taking mucinex, it seemed to help.  No wheeze.  No fevers.    Hypertension:    Using medication without problems or lightheadedness: yes Chest pain with exertion:no Edema:no Short of breath:no Average home BPs:  Has checked at home; lower than at OV.  LDL up, he's going to work more on diet.  He has gotten in lower prev.   PMH and SH reviewed  Meds, vitals, and allergies reviewed.   ROS: See HPI.  Otherwise negative.    GEN: nad, alert and oriented HEENT: mucous membranes moist, tm wnl x2, nasal exam stuffy, OP wnl NECK: supple w/o LA CV: rrr. PULM: ctab, no inc wob ABD: soft, +bs EXT: no edema SKIN: no acute rash

## 2012-12-08 NOTE — Assessment & Plan Note (Signed)
Would continue plain mucinex, zyrtec and flonase for now and this should resolve after the season change.  If persistent, then notify us.  He agrees.

## 2012-12-19 ENCOUNTER — Ambulatory Visit (INDEPENDENT_AMBULATORY_CARE_PROVIDER_SITE_OTHER): Payer: BC Managed Care – PPO | Admitting: Family Medicine

## 2012-12-19 ENCOUNTER — Encounter: Payer: Self-pay | Admitting: Family Medicine

## 2012-12-19 VITALS — BP 144/92 | HR 68 | Temp 98.3°F | Wt 194.5 lb

## 2012-12-19 DIAGNOSIS — J069 Acute upper respiratory infection, unspecified: Secondary | ICD-10-CM | POA: Insufficient documentation

## 2012-12-19 MED ORDER — AZITHROMYCIN 250 MG PO TABS: ORAL_TABLET | ORAL | Status: DC

## 2012-12-19 NOTE — Patient Instructions (Addendum)
Avoid decongestants (-D).  Zyrtec is okay, but not zyrtec-D.   If your BP stays >140/>90, then start taking 2 quinapril a day and let me know.  Plain tylenol or ibuprofen are okay.  Continue zyrtec if runny nose, flonase for congestion, plain mucinex for chest congestion.  Drink plenty of water with the mucinex.  Star the antibiotics today.  Take care.

## 2012-12-19 NOTE — Assessment & Plan Note (Signed)
See instructions.  Avoid decongestants.  Would start zmax at this point. Continue mucinex and flonase.  Nontoxic.  He agrees.  He'll notify me if BP stays up.  Should return to normal.

## 2012-12-19 NOTE — Progress Notes (Signed)
Pre-visit discussion using our clinic review tool. No additional management support is needed unless otherwise documented below in the visit note.  Prev had some cough at night.  Mucinex didn't help much so he took OTC decongestants, his BP was elevated yesterday.  He took an extra quinapril yesterday and his BP improved today.  Cough continues at night.  Now coughing up discolored sputum.  Sleep is disrupted. He's tired.  No fevers.  Chest is still congested.    Meds, vitals, and allergies reviewed.   ROS: See HPI.  Otherwise, noncontributory.  GEN: nad, alert and oriented HEENT: mucous membranes moist, tm w/o erythema, nasal exam w/o erythema, clear discharge noted,  OP with cobblestoning NECK: supple w/o LA CV: rrr.   PULM: ctab, no inc wob EXT: no edema SKIN: no acute rash

## 2013-03-28 ENCOUNTER — Other Ambulatory Visit: Payer: Self-pay | Admitting: Family Medicine

## 2013-08-27 ENCOUNTER — Encounter: Payer: Self-pay | Admitting: Family Medicine

## 2013-08-27 ENCOUNTER — Ambulatory Visit (INDEPENDENT_AMBULATORY_CARE_PROVIDER_SITE_OTHER): Payer: BC Managed Care – PPO | Admitting: Family Medicine

## 2013-08-27 VITALS — BP 142/96 | HR 60 | Temp 98.3°F | Wt 202.0 lb

## 2013-08-27 DIAGNOSIS — I10 Essential (primary) hypertension: Secondary | ICD-10-CM

## 2013-08-27 MED ORDER — VALSARTAN 40 MG PO TABS: 40.0000 mg | ORAL_TABLET | Freq: Every day | ORAL | Status: DC

## 2013-08-27 NOTE — Progress Notes (Signed)
Pre visit review using our clinic review tool, if applicable. No additional management support is needed unless otherwise documented below in the visit note.  HTN Now with a dry cough he attributes to the ACE.  It has irritated his throat.  Frequent throat clearing.  Has the feeling of needing to take a deep breath.  Had an ACE cough with other agents.  Another family member had a similar effect on quinapril.  Other family members have tolerated ARBs, diovan.  Drinking water helps some.    Meds, vitals, and allergies reviewed.   ROS: See HPI.  Otherwise, noncontributory.  GEN: nad, alert and oriented HEENT: mucous membranes moist, OP wnl, no stridor NECK: supple w/o LA CV: rrr PULM: ctab, no inc wob ABD: soft, +bs EXT: no edema SKIN: no acute rash

## 2013-08-27 NOTE — Patient Instructions (Signed)
Stop the quinapril.  Start the diovan and then update me about cough and your BP.  We can adjust the medicine dose if needed. Take care.  Glad to see you.

## 2013-08-28 NOTE — Assessment & Plan Note (Signed)
Change to ARB, he'll update me re: cough and BP readings.  He agrees.

## 2013-09-11 ENCOUNTER — Other Ambulatory Visit: Payer: Self-pay | Admitting: *Deleted

## 2013-09-11 MED ORDER — VALSARTAN 40 MG PO TABS: 40.0000 mg | ORAL_TABLET | Freq: Every day | ORAL | Status: DC

## 2013-10-10 ENCOUNTER — Ambulatory Visit (INDEPENDENT_AMBULATORY_CARE_PROVIDER_SITE_OTHER): Payer: BC Managed Care – PPO | Admitting: Family Medicine

## 2013-10-10 ENCOUNTER — Encounter: Payer: Self-pay | Admitting: Family Medicine

## 2013-10-10 VITALS — BP 138/80 | HR 60 | Temp 98.5°F | Wt 203.5 lb

## 2013-10-10 DIAGNOSIS — R05 Cough: Secondary | ICD-10-CM

## 2013-10-10 DIAGNOSIS — R059 Cough, unspecified: Secondary | ICD-10-CM

## 2013-10-10 MED ORDER — HYDROCODONE-HOMATROPINE 5-1.5 MG/5ML PO SYRP: 5.0000 mL | ORAL_SOLUTION | Freq: Three times a day (TID) | ORAL | Status: DC | PRN

## 2013-10-10 MED ORDER — FLUTICASONE PROPIONATE 50 MCG/ACT NA SUSP: NASAL | Status: DC

## 2013-10-10 MED ORDER — AZITHROMYCIN 250 MG PO TABS: ORAL_TABLET | ORAL | Status: DC

## 2013-10-10 NOTE — Progress Notes (Signed)
Pre visit review using our clinic review tool, if applicable. No additional management support is needed unless otherwise documented below in the visit note.  Cough.  Started 1 month ago.  Initially with a lot of green sputum.  The sputum has cleared up some in the meantime, but still with sputum production.  "Still barking" from the cough.  More activity --> more cough.  No fevers.  Cough is worse laying down and in early AM.  No chills.  No ear pain, no rhinorrhea.   HA from cough. He has tried to rest and get over this on his own, but he isn't well yet.    Meds, vitals, and allergies reviewed.   ROS: See HPI.  Otherwise, noncontributory.  GEN: nad, alert and oriented HEENT: mucous membranes moist, tm w/o erythema, nasal exam w/o erythema, clear discharge noted,  OP with cobblestoning NECK: supple w/o LA CV: rrr.   PULM: ctab, no inc wob EXT: no edema SKIN: no acute rash Cough noted on exam.

## 2013-10-10 NOTE — Assessment & Plan Note (Signed)
Presumed bronchitis, would cover for atypical given the exam.  Nontoxic.  He agrees.  Sedation caution on cough medicine.  F/u prn.  He agrees.

## 2013-10-10 NOTE — Patient Instructions (Signed)
Start the zithromax today and use the cough medicine as needed.  It can make you drowsy.   Take care.

## 2013-11-29 ENCOUNTER — Other Ambulatory Visit: Payer: Self-pay | Admitting: *Deleted

## 2013-11-29 MED ORDER — FLUTICASONE PROPIONATE 50 MCG/ACT NA SUSP: NASAL | Status: DC

## 2014-06-17 ENCOUNTER — Encounter: Payer: Self-pay | Admitting: Family Medicine

## 2014-06-17 ENCOUNTER — Ambulatory Visit (INDEPENDENT_AMBULATORY_CARE_PROVIDER_SITE_OTHER): Payer: BLUE CROSS/BLUE SHIELD | Admitting: Family Medicine

## 2014-06-17 VITALS — BP 146/100 | HR 92 | Temp 98.4°F | Wt 210.8 lb

## 2014-06-17 DIAGNOSIS — H8110 Benign paroxysmal vertigo, unspecified ear: Secondary | ICD-10-CM | POA: Diagnosis not present

## 2014-06-17 MED ORDER — MECLIZINE HCL 25 MG PO TABS: 25.0000 mg | ORAL_TABLET | Freq: Three times a day (TID) | ORAL | Status: DC | PRN

## 2014-06-17 MED ORDER — ONDANSETRON HCL 4 MG PO TABS: 4.0000 mg | ORAL_TABLET | Freq: Three times a day (TID) | ORAL | Status: DC | PRN

## 2014-06-17 NOTE — Progress Notes (Signed)
Pre visit review using our clinic review tool, if applicable. No additional management support is needed unless otherwise documented below in the visit note.  Woke up Friday night/Saturday AM.  Room was spinning.  Couldn't eat, was vomiting.  Headache.  abd pain.  Minimal solids in the meantime.  Dizzy sensation is some better in the meantime.  Vomiting from the vertigo.  Better with holding head still.  Worse sx with head movement.  No focal motor changes.  No fevers.  He'll get cold, then hot. Normal hearing, no ear ringing.    Meds, vitals, and allergies reviewed.   ROS: See HPI.  Otherwise, noncontributory.  GEN: nad, alert and oriented x3 HEENT: mucous membranes moist, tm wnl, nasal exam slightly stuffy. OP wnl except for likely geographic tongue noted NECK: supple w/o LA CV: rrr. PULM: ctab, no inc wob ABD: soft, +bs EXT: no edema SKIN: no acute rash CN 2-12 wnl B, S/S/DTR wnl x4, gait wnl, cerebellar testing wnl

## 2014-06-17 NOTE — Patient Instructions (Signed)
Take meclizine first and try to get some rest.  Drink sips of fluids and skip your BP medicine for 1-2 days, until you are feeling better.  Take zofran if needed for nausea.   Take care.  This should gradually improve.

## 2014-06-18 DIAGNOSIS — H8110 Benign paroxysmal vertigo, unspecified ear: Secondary | ICD-10-CM | POA: Insufficient documentation

## 2014-06-18 NOTE — Assessment & Plan Note (Signed)
He has known h/o vertigo, this is similar but more pronounced.  Clearly reproduced with head movement.  No sx with eye tracking.   Likely with vomiting from the vertigo sx.  H/o likely from sleep deprivation and dec in PO fluids.  Take meclizine first, rest at home.  Drink sips of fluids and skip your ARB for 1-2 days, until his is feeling better.  Take zofran if needed for nausea.  He agrees.  F/u prn.  He has a driver here today.  Okay for outpatient f/u.

## 2014-07-15 ENCOUNTER — Ambulatory Visit: Payer: Self-pay | Admitting: Family Medicine

## 2014-07-15 ENCOUNTER — Ambulatory Visit (INDEPENDENT_AMBULATORY_CARE_PROVIDER_SITE_OTHER): Payer: BLUE CROSS/BLUE SHIELD | Admitting: Family Medicine

## 2014-07-15 ENCOUNTER — Encounter: Payer: Self-pay | Admitting: Family Medicine

## 2014-07-15 VITALS — BP 140/82 | HR 60 | Temp 98.7°F | Wt 211.5 lb

## 2014-07-15 DIAGNOSIS — T148 Other injury of unspecified body region: Secondary | ICD-10-CM | POA: Diagnosis not present

## 2014-07-15 DIAGNOSIS — W57XXXA Bitten or stung by nonvenomous insect and other nonvenomous arthropods, initial encounter: Secondary | ICD-10-CM

## 2014-07-15 MED ORDER — DOXYCYCLINE HYCLATE 100 MG PO TABS: 100.0000 mg | ORAL_TABLET | Freq: Two times a day (BID) | ORAL | Status: DC

## 2014-07-15 NOTE — Progress Notes (Signed)
Pre visit review using our clinic review tool, if applicable. No additional management support is needed unless otherwise documented below in the visit note.  Was seen last month for vertigo.  He was getting better, but then the sx would return.  Would still have occ sx of room spinning when getting up.  Later on (ie recently) he has some scrotal itching, noted incidentally.  He just didn't feel well.  He had some burning with urination, but no discharge.  This past Friday night, he had L knee swelling and pain, with pain on ROM.  Started taking amoxil and got some better in the meantime, ie dysuria got some better.    Found at tick on his scrotum last night.  It was engorged.  Then founds another tick on his L groin.  Both removed.  Fatigue noted by patient.   No other rash.    Meds, vitals, and allergies reviewed.   ROS: See HPI.  Otherwise, noncontributory.  GEN: nad, alert and oriented HEENT: mucous membranes moist NECK: supple w/o LA CV: rrr. PULM: ctab, no inc wob ABD: soft, +bs EXT: no edema SKIN: no acute rash but bite sites with local irritation on the scrotum and L groin.   L knee not puffy or red or hot with normal ROM

## 2014-07-15 NOTE — Patient Instructions (Signed)
Start doxy today and update me as needed.  Take care.  Glad to see you.

## 2014-07-16 DIAGNOSIS — W57XXXA Bitten or stung by nonvenomous insect and other nonvenomous arthropods, initial encounter: Secondary | ICD-10-CM | POA: Insufficient documentation

## 2014-07-16 NOTE — Assessment & Plan Note (Signed)
With atypical sx after tick bite.  Nontoxic.  At this point, still well appearing.  We would treat him today regardless of tick labs, so elected not to test him.  He agrees with the rationale.  He'll update me as needed.  Still okay for outpatient f/u.

## 2014-08-19 ENCOUNTER — Other Ambulatory Visit: Payer: Self-pay | Admitting: Family Medicine

## 2014-08-20 NOTE — Telephone Encounter (Signed)
Received refill request electronically Last office visit 06/17/14/acute See allergy/contraindication Is it okay to refill medication?

## 2014-08-21 NOTE — Telephone Encounter (Signed)
Sent. Thanks.   

## 2014-10-25 ENCOUNTER — Other Ambulatory Visit: Payer: Self-pay | Admitting: Family Medicine

## 2014-10-31 ENCOUNTER — Other Ambulatory Visit: Payer: Self-pay | Admitting: Family Medicine

## 2015-02-11 ENCOUNTER — Other Ambulatory Visit: Payer: Self-pay

## 2015-02-11 NOTE — Telephone Encounter (Signed)
Pt left v/m requesting refill valsartan to [facility]; last refilled # [phone] on 10/31/14. Last seen BP f/u 08/27/13; last acute visit 07/15/14. No future appt scheduled.Please advise.

## 2015-02-12 MED ORDER — VALSARTAN 40 MG PO TABS: ORAL_TABLET | ORAL | Status: DC

## 2015-02-12 NOTE — Telephone Encounter (Signed)
Left message on answering machine at home to call back. 

## 2015-02-12 NOTE — Telephone Encounter (Signed)
Sent.  Schedule CPE when possible.  Thanks.  

## 2015-02-13 NOTE — Telephone Encounter (Signed)
Patient advised and will call later for CPE appt.

## 2015-05-06 ENCOUNTER — Other Ambulatory Visit: Payer: Self-pay | Admitting: Family Medicine

## 2015-05-18 ENCOUNTER — Other Ambulatory Visit: Payer: Self-pay | Admitting: Family Medicine

## 2015-07-08 ENCOUNTER — Other Ambulatory Visit: Payer: Self-pay | Admitting: Family Medicine

## 2015-07-20 ENCOUNTER — Other Ambulatory Visit: Payer: Self-pay | Admitting: Family Medicine

## 2015-07-20 DIAGNOSIS — I10 Essential (primary) hypertension: Secondary | ICD-10-CM

## 2015-07-23 ENCOUNTER — Other Ambulatory Visit (INDEPENDENT_AMBULATORY_CARE_PROVIDER_SITE_OTHER): Payer: BC Managed Care – PPO

## 2015-07-23 DIAGNOSIS — I10 Essential (primary) hypertension: Secondary | ICD-10-CM | POA: Diagnosis not present

## 2015-07-23 LAB — COMPREHENSIVE METABOLIC PANEL
ALT: 16 U/L (ref 0–53)
AST: 21 U/L (ref 0–37)
Albumin: 4.9 g/dL (ref 3.5–5.2)
Alkaline Phosphatase: 61 U/L (ref 39–117)
BILIRUBIN TOTAL: 0.7 mg/dL (ref 0.2–1.2)
BUN: 23 mg/dL (ref 6–23)
CALCIUM: 10.4 mg/dL (ref 8.4–10.5)
CO2: 29 meq/L (ref 19–32)
CREATININE: 1.3 mg/dL (ref 0.40–1.50)
Chloride: 102 mEq/L (ref 96–112)
GFR: 59.28 mL/min — ABNORMAL LOW (ref >60.00)
GLUCOSE: 88 mg/dL (ref 70–99)
Potassium: 4.2 mEq/L (ref 3.5–5.1)
Sodium: 135 mEq/L (ref 135–145)
Total Protein: 8 g/dL (ref 6.0–8.3)

## 2015-07-23 LAB — LIPID PANEL
CHOL/HDL RATIO: 6
Cholesterol: 261 mg/dL — ABNORMAL HIGH (ref 0–200)
HDL: 44.1 mg/dL (ref >39.00)
LDL Cholesterol: 180 mg/dL — ABNORMAL HIGH (ref 0–99)
NONHDL: 216.75
Triglycerides: 182 mg/dL — ABNORMAL HIGH (ref 0.0–149.0)
VLDL: 36.4 mg/dL (ref 0.0–40.0)

## 2015-07-31 ENCOUNTER — Encounter: Payer: Self-pay | Admitting: Family Medicine

## 2015-07-31 ENCOUNTER — Ambulatory Visit (INDEPENDENT_AMBULATORY_CARE_PROVIDER_SITE_OTHER): Payer: BC Managed Care – PPO | Admitting: Family Medicine

## 2015-07-31 VITALS — BP 142/90 | HR 50 | Temp 98.5°F | Ht 69.0 in | Wt 214.5 lb

## 2015-07-31 DIAGNOSIS — Z0000 Encounter for general adult medical examination without abnormal findings: Secondary | ICD-10-CM | POA: Diagnosis not present

## 2015-07-31 DIAGNOSIS — Z23 Encounter for immunization: Secondary | ICD-10-CM | POA: Diagnosis not present

## 2015-07-31 DIAGNOSIS — Z1211 Encounter for screening for malignant neoplasm of colon: Secondary | ICD-10-CM

## 2015-07-31 DIAGNOSIS — Z7189 Other specified counseling: Secondary | ICD-10-CM

## 2015-07-31 DIAGNOSIS — E785 Hyperlipidemia, unspecified: Secondary | ICD-10-CM

## 2015-07-31 DIAGNOSIS — I10 Essential (primary) hypertension: Secondary | ICD-10-CM

## 2015-07-31 MED ORDER — VALSARTAN 40 MG PO TABS: 40.0000 mg | ORAL_TABLET | Freq: Every day | ORAL | Status: DC

## 2015-07-31 NOTE — Patient Instructions (Addendum)
Check with your insurance to see if they will cover the shingles shot. I would get a flu shot each fall.   Recheck lipids in about 6 months, fasting lab visit.   Work on [employer] in the meantime.  Take care.  Glad to see you.

## 2015-07-31 NOTE — Progress Notes (Signed)
Pre visit review using our clinic review tool, if applicable. No additional management support is needed unless otherwise documented below in the visit note.  CPE- See plan.  Routine anticipatory guidance given to patient.  See health maintenance. Tetanus 2017 Flu shot encouraged.   Shingles shot encouraged.  PNA shot due at 67 Prostate cancer screening and PSA options (with potential risks and benefits of testing vs not testing) were discussed along with recent recs/guidelines. He declined testing PSA at this point. D/w patient [id number] for colon cancer screening, including IFOB vs. colonoscopy. Risks and benefits of both were discussed and patient voiced understanding. Pt elects [id number].  Living will d/wp. Wife would be designated if incapacitated.  Diet and exercise- he's working on both. his diet was off with vacation recently.   [facility]    Hypertension:    Using medication without problems or lightheadedness: yes Chest pain with exertion:no Edema:no Short of breath:no Average home BPs: usually a little lower than today on home checks.  D/w pt.   Labs d/w pt.    PMH and SH reviewed  Meds, vitals, and allergies reviewed.   ROS: Per HPI.  Unless specifically indicated otherwise in HPI, the patient denies:  General: fever. Eyes: acute vision changes ENT: sore throat Cardiovascular: chest pain Respiratory: SOB GI: vomiting GU: dysuria Musculoskeletal: acute back pain Derm: acute rash Neuro: acute motor dysfunction Psych: worsening mood Endocrine: polydipsia Heme: bleeding Allergy: hayfever  GEN: nad, alert and oriented HEENT: mucous membranes moist NECK: supple w/o LA CV: rrr. PULM: ctab, no inc wob ABD: soft, +bs EXT: no edema SKIN: no acute rash

## 2015-08-01 DIAGNOSIS — Z7189 Other specified counseling: Secondary | ICD-10-CM | POA: Insufficient documentation

## 2015-08-01 NOTE — Assessment & Plan Note (Signed)
Minimal elevation. Diet and exercise- he's recently working on both. his diet was off with vacation recently.   Continue med as is, work on [employer] and update me as needed.  BP usually lower at home. Labs d/w pt.

## 2015-08-01 NOTE — Assessment & Plan Note (Signed)
Diet and exercise- he's recently working on both. his diet was off with vacation recently.   Recheck lipids in about 6 months.  He agrees.

## 2015-08-01 NOTE — Assessment & Plan Note (Signed)
Tetanus 2017 Flu shot encouraged.   Shingles shot encouraged.  PNA shot due at 67 Prostate cancer screening and PSA options (with potential risks and benefits of testing vs not testing) were discussed along with recent recs/guidelines. He declined testing PSA at this point. D/w patient [id number] for colon cancer screening, including IFOB vs. colonoscopy. Risks and benefits of both were discussed and patient voiced understanding. Pt elects [id number].  Living will d/wp. Wife would be designated if incapacitated.  Diet and exercise- he's recently working on both. his diet was off with vacation recently.   [facility].

## 2015-09-11 ENCOUNTER — Other Ambulatory Visit: Payer: Self-pay | Admitting: Family Medicine

## 2015-12-02 ENCOUNTER — Encounter: Payer: Self-pay | Admitting: Family Medicine

## 2016-01-04 ENCOUNTER — Other Ambulatory Visit: Payer: Self-pay | Admitting: Family Medicine

## 2016-03-01 ENCOUNTER — Other Ambulatory Visit: Payer: Self-pay | Admitting: Family Medicine

## 2016-03-26 ENCOUNTER — Telehealth: Payer: BC Managed Care – PPO | Admitting: Family

## 2016-03-26 DIAGNOSIS — B9689 Other specified bacterial agents as the cause of diseases classified elsewhere: Secondary | ICD-10-CM

## 2016-03-26 DIAGNOSIS — J028 Acute pharyngitis due to other specified organisms: Secondary | ICD-10-CM

## 2016-03-26 MED ORDER — AZITHROMYCIN 250 MG PO TABS: ORAL_TABLET | ORAL | 0 refills | Status: DC

## 2016-03-26 MED ORDER — BENZONATATE 100 MG PO CAPS: 100.0000 mg | ORAL_CAPSULE | Freq: Three times a day (TID) | ORAL | 0 refills | Status: DC | PRN

## 2016-03-26 MED ORDER — PREDNISONE 5 MG PO TABS: 5.0000 mg | ORAL_TABLET | ORAL | 0 refills | Status: DC

## 2016-03-26 NOTE — Progress Notes (Signed)
We are sorry that you are not feeling well.  Here is how we plan to help!  Based on what you have shared with me it looks like you have upper respiratory tract inflammation that has resulted in a significant cough.  Inflammation and infection in the upper respiratory tract is commonly called bronchitis and has four common causes:  Allergies, Viral Infections, Acid Reflux and Bacterial Infections.  Allergies, viruses and acid reflux are treated by controlling symptoms or eliminating the cause. An example might be a cough caused by taking certain blood pressure medications. You stop the cough by changing the medication. Another example might be a cough caused by acid reflux. Controlling the reflux helps control the cough.  Based on your presentation I believe you most likely have A cough due to bacteria.  When patients have a fever and a productive cough with a change in color or increased sputum production, we are concerned about bacterial bronchitis.  If left untreated it can progress to pneumonia.  If your symptoms do not improve with your treatment plan it is important that you contact your provider.   I have prescribed Azithromyin 250 mg: two tables now and then one tablet daily for 4 additonal days    In addition you may use A non-prescription cough medication called Mucinex DM: take 2 tablets every 12 hours. and A prescription cough medication called Tessalon Perles 100mg. You may take 1-2 capsules every 8 hours as needed for your cough.  Sterapred 5 mg dosepak  USE OF BRONCHODILATOR ("RESCUE") INHALERS: There is a risk from using your bronchodilator too frequently.  The risk is that over-reliance on a medication which only relaxes the muscles surrounding the breathing tubes can reduce the effectiveness of medications prescribed to reduce swelling and congestion of the tubes themselves.  Although you feel brief relief from the bronchodilator inhaler, your asthma may actually be worsening with the  tubes becoming more swollen and filled with mucus.  This can delay other crucial treatments, such as oral steroid medications. If you need to use a bronchodilator inhaler daily, several times per day, you should discuss this with your provider.  There are probably better treatments that could be used to keep your asthma under control.     HOME CARE . Only take medications as instructed by your medical team. . Complete the entire course of an antibiotic. . Drink plenty of fluids and get plenty of rest. . Avoid close contacts especially the very young and the elderly . Cover your mouth if you cough or cough into your sleeve. . Always remember to wash your hands . A steam or ultrasonic humidifier can help congestion.   GET HELP RIGHT AWAY IF: . You develop worsening fever. . You become short of breath . You cough up blood. . Your symptoms persist after you have completed your treatment plan MAKE SURE YOU   Understand these instructions.  Will watch your condition.  Will get help right away if you are not doing well or get worse.  Your e-visit answers were reviewed by a board certified advanced clinical practitioner to complete your personal care plan.  Depending on the condition, your plan could have included both over the counter or prescription medications. If there is a problem please reply  once you have received a response from your provider. Your safety is important to us.  If you have drug allergies check your prescription carefully.    You can use MyChart to ask questions about today's   visit, request a non-urgent call back, or ask for a work or school excuse for 24 hours related to this e-Visit. If it has been greater than 24 hours you will need to follow up with your provider, or enter a new e-Visit to address those concerns. You will get an e-mail in the next two days asking about your experience.  I hope that your e-visit has been valuable and will speed your recovery. Thank you  for using e-visits.   

## 2016-04-28 ENCOUNTER — Other Ambulatory Visit: Payer: Self-pay | Admitting: Family Medicine

## 2016-04-28 NOTE — Telephone Encounter (Signed)
Sent. Thanks.   

## 2016-04-28 NOTE — Telephone Encounter (Signed)
Received refill electronically See allergy/contraindication Last office visit 07/31/15 Last refill 07/31/15 #90/3

## 2016-09-02 ENCOUNTER — Other Ambulatory Visit: Payer: Self-pay | Admitting: Family Medicine

## 2016-09-02 ENCOUNTER — Encounter: Payer: Self-pay | Admitting: Family Medicine

## 2016-09-02 DIAGNOSIS — I10 Essential (primary) hypertension: Secondary | ICD-10-CM

## 2016-09-03 ENCOUNTER — Other Ambulatory Visit (INDEPENDENT_AMBULATORY_CARE_PROVIDER_SITE_OTHER): Payer: BC Managed Care – PPO

## 2016-09-03 DIAGNOSIS — I10 Essential (primary) hypertension: Secondary | ICD-10-CM | POA: Diagnosis not present

## 2016-09-03 LAB — COMPREHENSIVE METABOLIC PANEL
ALBUMIN: 4.8 g/dL (ref 3.5–5.2)
ALT: 12 U/L (ref 0–53)
AST: 19 U/L (ref 0–37)
Alkaline Phosphatase: 57 U/L (ref 39–117)
BUN: 21 mg/dL (ref 6–23)
CO2: 28 mEq/L (ref 19–32)
CREATININE: 1.35 mg/dL (ref 0.40–1.50)
Calcium: 9.8 mg/dL (ref 8.4–10.5)
Chloride: 103 mEq/L (ref 96–112)
GFR: 56.55 mL/min — ABNORMAL LOW (ref >60.00)
GLUCOSE: 95 mg/dL (ref 70–99)
Potassium: 4.1 mEq/L (ref 3.5–5.1)
SODIUM: 138 meq/L (ref 135–145)
TOTAL PROTEIN: 7.6 g/dL (ref 6.0–8.3)
Total Bilirubin: 0.9 mg/dL (ref 0.2–1.2)

## 2016-09-03 LAB — LIPID PANEL
CHOL/HDL RATIO: 6
Cholesterol: 228 mg/dL — ABNORMAL HIGH (ref 0–200)
HDL: 40.3 mg/dL (ref >39.00)
LDL CALC: 152 mg/dL — AB (ref 0–99)
NONHDL: 187.96
TRIGLYCERIDES: 178 mg/dL — AB (ref 0.0–149.0)
VLDL: 35.6 mg/dL (ref 0.0–40.0)

## 2016-09-09 ENCOUNTER — Encounter: Payer: BC Managed Care – PPO | Admitting: Family Medicine

## 2016-09-09 ENCOUNTER — Other Ambulatory Visit: Payer: Self-pay | Admitting: Family Medicine

## 2016-09-16 ENCOUNTER — Encounter: Payer: Self-pay | Admitting: Family Medicine

## 2016-09-16 ENCOUNTER — Ambulatory Visit (INDEPENDENT_AMBULATORY_CARE_PROVIDER_SITE_OTHER): Payer: BC Managed Care – PPO | Admitting: Family Medicine

## 2016-09-16 VITALS — BP 160/80 | HR 50 | Temp 98.5°F | Ht 69.0 in | Wt 208.8 lb

## 2016-09-16 DIAGNOSIS — Z0000 Encounter for general adult medical examination without abnormal findings: Secondary | ICD-10-CM | POA: Diagnosis not present

## 2016-09-16 DIAGNOSIS — Z7189 Other specified counseling: Secondary | ICD-10-CM

## 2016-09-16 DIAGNOSIS — I10 Essential (primary) hypertension: Secondary | ICD-10-CM

## 2016-09-16 MED ORDER — LOSARTAN POTASSIUM 25 MG PO TABS: 25.0000 mg | ORAL_TABLET | Freq: Every day | ORAL | 3 refills | Status: DC

## 2016-09-16 MED ORDER — ASPIRIN 81 MG PO TABS: 81.0000 mg | ORAL_TABLET | Freq: Every day | ORAL | Status: DC

## 2016-09-16 NOTE — Assessment & Plan Note (Addendum)
Controlled on home checks.  Change to losartan due to recall, d/w pt.  he will start losartan 25 mg a day. If blood pressure still not controlled he can increase to 50 mg then update me. He agrees.

## 2016-09-16 NOTE — Progress Notes (Signed)
CPE- See plan.  Routine anticipatory guidance given to patient.  See health maintenance.  The possibility exists that previously documented standard health maintenance information may have been brought forward from a previous encounter into this note.  If needed, that same information has been updated to reflect the current situation based on today's encounter.    Tetanus 2017 Flu shot encouraged.   Shingles shot encouraged. See AVS.   PNA shot due at 67.   Prostate cancer screening and PSA options (with potential risks and benefits of testing vs not testing) were discussed along with recent recs/guidelines. He declined testing PSA at this point. D/w patient [id number] for colon cancer screening, including IFOB vs. Colonoscopy vs cologuard. Risks and benefits of both were discussed and patient voiced understanding. Pt elects for: cologuard.   Living will d/w pt. Wife would be designated if patient were incapacitated.  Diet and exercise- he's working on both.  [facility]    His wife is going through treatment for breast cancer.  D/w pt.  Support offered.   Hypertension:    Using medication without problems or lightheadedness: yes Chest pain with exertion:no Edema:no Short of breath:no Average home BPs:130/70s on home check.  He has white coat htn component.   D/w pt about valsartan recall.   Labs d/w pt.    PMH and SH reviewed  Meds, vitals, and allergies reviewed.   ROS: Per HPI.  Unless specifically indicated otherwise in HPI, the patient denies:  General: fever. Eyes: acute vision changes ENT: sore throat Cardiovascular: chest pain Respiratory: SOB GI: vomiting GU: dysuria Musculoskeletal: acute back pain Derm: acute rash Neuro: acute motor dysfunction Psych: worsening mood Endocrine: polydipsia Heme: bleeding Allergy: hayfever  GEN: nad, alert and oriented HEENT: mucous membranes moist NECK: supple w/o LA CV:  rrr. PULM: ctab, no inc wob ABD: soft, +bs EXT: no edema SKIN: no acute rash

## 2016-09-16 NOTE — Assessment & Plan Note (Signed)
Tetanus 2017 Flu shot encouraged.   Shingles shot encouraged. See AVS.   PNA shot due at 67.   Prostate cancer screening and PSA options (with potential risks and benefits of testing vs not testing) were discussed along with recent recs/guidelines. He declined testing PSA at this point. D/w patient [id number] for colon cancer screening, including IFOB vs. Colonoscopy vs cologuard. Risks and benefits of both were discussed and patient voiced understanding. Pt elects for: cologuard.   Living will d/w pt. Wife would be designated if patient were incapacitated.  Diet and exercise- he's working on both.  [facility].    His wife is going through treatment for breast cancer.  D/w pt.  Support offered.

## 2016-09-16 NOTE — Patient Instructions (Addendum)
Check with your insurance to see if they will cover the shingrix shot. I would get a flu shot each fall.   Take care.  Glad to see you.  Update me as needed.   We'll get the cologuard test sent to you.  Start losartan, stop valsartan.  If your BP is still up, then okay to increase to 50mg  a day.  Update me as needed.

## 2016-10-19 ENCOUNTER — Encounter: Payer: Self-pay | Admitting: Family Medicine

## 2016-10-19 LAB — COLOGUARD

## 2016-10-28 ENCOUNTER — Encounter: Payer: Self-pay | Admitting: Family Medicine

## 2017-01-05 ENCOUNTER — Ambulatory Visit: Payer: BC Managed Care – PPO | Admitting: Family Medicine

## 2017-01-05 ENCOUNTER — Encounter: Payer: Self-pay | Admitting: Family Medicine

## 2017-01-05 VITALS — BP 160/80 | HR 71 | Temp 97.9°F | Wt 219.0 lb

## 2017-01-05 DIAGNOSIS — S060X1A Concussion with loss of consciousness of 30 minutes or less, initial encounter: Secondary | ICD-10-CM | POA: Diagnosis not present

## 2017-01-05 DIAGNOSIS — M25519 Pain in unspecified shoulder: Secondary | ICD-10-CM | POA: Diagnosis not present

## 2017-01-05 DIAGNOSIS — R21 Rash and other nonspecific skin eruption: Secondary | ICD-10-CM | POA: Diagnosis not present

## 2017-01-05 MED ORDER — CLOTRIMAZOLE-BETAMETHASONE 1-0.05 % EX CREA: 1.0000 "application " | TOPICAL_CREAM | Freq: Two times a day (BID) | CUTANEOUS | 0 refills | Status: DC

## 2017-01-05 MED ORDER — HYDROCODONE-ACETAMINOPHEN 5-325 MG PO TABS: 1.0000 | ORAL_TABLET | Freq: Four times a day (QID) | ORAL | 0 refills | Status: DC | PRN

## 2017-01-05 NOTE — Patient Instructions (Addendum)
Presumed concussion.   Rest in the meantime.   Pendulum exercises with your arm.  Ask about follow up with Dr. Lorelei Pont in about 2 weeks.  Use the cream on the rash.  Update me as needed.

## 2017-01-05 NOTE — Progress Notes (Signed)
The snow crushed a building that he built near his home.  Discussed. He was able to salvage some of the contents.    He fell off a bulldozer recently, 01/01/17.  He was getting of the tractor, "and the dirt gave way."  [place] [address] and hit his L buttock, locally bruised.  He evidently hit his L shoulder, locally bruised.  Presumed brief LOC, d/w pt. He was seeing stars "when I came to."  No known head injury.  No head pain.  No vision changes.  No residual neurologic sx.  He does have L shoulder pain with ROM.    Also with local rash on the L axilla.  No change in deoderant.  No R sided sx.    Meds, vitals, and allergies reviewed.   ROS: Per HPI unless specifically indicated in ROS section   nad ncat Neck supple no LA rrr ctab Abd soft Speech wnl L shoulder not ttp locally but with bruising on the proximal portion of the upper arm noted.  AC not ttp No pain with pendulum ROM but pain with int/ext rotation and abduction.  Grip wnl, speed's neg Distally NV intact.   Faint maculopapular rash with irregular border noted in the left axilla.

## 2017-01-06 DIAGNOSIS — R21 Rash and other nonspecific skin eruption: Secondary | ICD-10-CM | POA: Insufficient documentation

## 2017-01-06 DIAGNOSIS — S060X9A Concussion with loss of consciousness of unspecified duration, initial encounter: Secondary | ICD-10-CM | POA: Insufficient documentation

## 2017-01-06 DIAGNOSIS — S060XAA Concussion with loss of consciousness status unknown, initial encounter: Secondary | ICD-10-CM | POA: Insufficient documentation

## 2017-01-06 DIAGNOSIS — L989 Disorder of the skin and subcutaneous tissue, unspecified: Secondary | ICD-10-CM | POA: Insufficient documentation

## 2017-01-06 DIAGNOSIS — M25519 Pain in unspecified shoulder: Secondary | ICD-10-CM | POA: Insufficient documentation

## 2017-01-06 NOTE — Assessment & Plan Note (Signed)
Likely combination of irritation and superficial fungal infection related to persistent abduction of the shoulder.  Okay to use Lotrisone locally.  Update me as needed.

## 2017-01-06 NOTE — Assessment & Plan Note (Signed)
Presumed rotator cuff pathology, related to the fall.  Discussed with patient.  Imaging deferred at this point.  He is not tender over bony prominences and imaging at this point is likely not going to be comfortable or likely to change management.  Discussed with patient about pendulum range of motion to prevent a frozen shoulder.  He can use Vicodin as needed for pain.  I asked him to follow-up with Dr. Lorelei Pont in about 2 weeks.  We talked about referral in general and the patient preferred to follow-up with sports medicine clinic.  That is reasonable.  I appreciate input from Dr. Lorelei Pont.  Routine cautions given to patient.

## 2017-01-06 NOTE — Assessment & Plan Note (Signed)
Presumed, with likely brief LOC w/o residual sx, d/w pt about concussion path/phys in general.  With no residual sx (no headaches, numbness, tingling, balance trouble, etc.) thenfollow-up needed at this point.  Routine cautions given to patient.  He agrees.

## 2017-01-13 ENCOUNTER — Ambulatory Visit: Payer: BC Managed Care – PPO | Admitting: Family Medicine

## 2017-01-13 ENCOUNTER — Encounter: Payer: Self-pay | Admitting: Family Medicine

## 2017-01-13 ENCOUNTER — Ambulatory Visit (INDEPENDENT_AMBULATORY_CARE_PROVIDER_SITE_OTHER)
Admission: RE | Admit: 2017-01-13 | Discharge: 2017-01-13 | Disposition: A | Discharge status: Home / Self Care | Payer: BC Managed Care – PPO | Source: Ambulatory Visit | Attending: Family Medicine | Admitting: Family Medicine

## 2017-01-13 ENCOUNTER — Other Ambulatory Visit: Payer: Self-pay

## 2017-01-13 VITALS — BP 140/72 | HR 49 | Temp 98.3°F | Ht 69.0 in | Wt 214.2 lb

## 2017-01-13 DIAGNOSIS — G8911 Acute pain due to trauma: Secondary | ICD-10-CM

## 2017-01-13 DIAGNOSIS — M25512 Pain in left shoulder: Secondary | ICD-10-CM | POA: Diagnosis not present

## 2017-01-13 NOTE — Progress Notes (Signed)
Dr. Frederico Hamman T. Gertie Broerman, MD, [facility] [place], [address] Phone: [phone] Fax: [phone]  01/13/2017  Patient: Bryce Villanueva, MRN: [id number], DOB: 1950-03-26, 66 y.o.  Primary Physician:  Tonia Ghent, MD   Chief Complaint  Patient presents with  . Shoulder Pain    Left-Bulldozer accident 01/02/17   Subjective:   Bryce Villanueva is a 66 y.o. very pleasant male patient who presents with the following:  Recently fell off of his bulldozer. First night up all night and the last few days can. Retired now.   Patient recently fell off of a bulldozer 11 days ago, and then came down into some loose dirt and fell down an embankment.  He believes that he struck his head and was unconscious for a short amount of time, but came to when his son came to evaluate him.  He is still having some nausea, but he otherwise is improving from his head injury standpoint.  He is primarily here to evaluate and discuss his left-sided shoulder injury.  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 Shellia Carwin distantly.  On the left, he currently has extensive bruising on his left shoulder, and he is only able to elevate his arm about 25 degrees.  He is having some significant pain.  Past Medical History, Surgical History, Social History, Family History, Problem List, Medications, and Allergies have been reviewed and updated if relevant.  Patient Active Problem List   Diagnosis Date Noted  . Concussion 01/06/2017  . Shoulder pain 01/06/2017  . Rash 01/06/2017  . Advance care planning 08/01/2015  . Benign paroxysmal positional vertigo 06/18/2014  . Routine general medical examination at a health care facility 12/08/2012  . Pain in joint, ankle and foot 06/16/2012  . HSV (herpes simplex virus) infection 05/19/2012  . Allergic rhinitis 05/19/2012  . Essential hypertension, benign  05/19/2012  . HLD (hyperlipidemia) 02/18/2009  . ARTHRITIS, RIGHT ANKLE 02/17/2009    Past Medical History:  Diagnosis Date  . Biceps tendon tear RIGHT ARM  . Diverticulosis of colon   . Hypertension   . Kidney stones   . Labral tear of shoulder     Past Surgical History:  Procedure Laterality Date  . FOOT SURGERY  03/05/09   RIGHT ANKLE  BONE SPUR REMOVAL (Dr. Beola Cord)  . SHOULDER SURGERY Right 2013    Social History   Socioeconomic History  . Marital status: Married    Spouse name: Not on file  . Number of children: 2  . Years of education: Not on file  . Highest education level: Not on file  Social Needs  . Financial resource strain: Not on file  . Food insecurity - worry: Not on file  . Food insecurity - inability: Not on file  . Transportation needs - medical: Not on file  . Transportation needs - non-medical: Not on file  Occupational History  . Occupation: Chartered certified accountant: [employer]  Tobacco Use  . Smoking status: Never Smoker  . Smokeless tobacco: Never Used  Substance and Sexual Activity  . Alcohol use: Yes    Alcohol/week: 0.6 oz    Types: 1 Standard drinks or equivalent per week    Comment: one drink on the weekends  . Drug use: No  . Sexual activity: Not on file  Other Topics Concern  . 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 Emphysema Father        smoker  . Alcohol abuse Father   . Hypertension Brother   . Alcohol abuse Brother   . Hypertension Son   . Hypertension Brother   . Pulmonary embolism Brother   . Prostate cancer Neg Hx   . Colon cancer Neg Hx     Allergies  Allergen Reactions  . Amlodipine     Frequent urination  . Lisinopril     Dry cough  . Pravastatin     GI upset, intolerant    Medication list reviewed and updated in full in  [place].  GEN: No fevers, chills. Nontoxic. Primarily MSK c/o today. MSK: Detailed in the HPI GI: tolerating PO intake without difficulty Neuro: No numbness, parasthesias, or tingling associated. Otherwise the pertinent positives of the ROS are noted above.   Objective:   BP 140/72   Pulse (!) 49   Temp 98.3 F (36.8 C) (Oral)   Ht 5\' 9"  (1.753 m)   Wt 214 lb 4 oz (97.2 kg)   BMI 31.64 kg/m    GEN: WDWN, NAD, Non-toxic, Alert & Oriented x 3 HEENT: Atraumatic, Normocephalic.  Ears and Nose: No external deformity. EXTR: No clubbing/cyanosis/edema NEURO: Normal gait.  PSYCH: Normally interactive. Conversant. Not depressed or anxious appearing.  Calm demeanor.    On the left shoulder: Nontender along the clavicle and nontender at the [facility] joint.  Proximal humerus is moderately tender to palpation.  There is extensive bruising throughout the entirety of the humerus.  Active abduction to 25 degrees.  Passive abduction easily to 135 degrees or greater.  Strength is 2-/5. External range of motion is 3+/5, strength Internal range of motion is 4-/5, strength  Drop test is positive   radiology: Dg Shoulder Left  Result Date: 01/13/2017 CLINICAL DATA:  Acute left shoulder pain after injury. EXAM: LEFT SHOULDER - 2+ VIEW COMPARISON:  None. FINDINGS: There is no evidence of fracture or dislocation. Mild degenerative changes seen involving the left acromioclavicular joint. Soft tissues are unremarkable. 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 No acute abnormality seen the left shoulder. Electronically Signed   By: Marijo Conception, M.D.   On: 01/13/2017 10:58     Assessment and Plan:   Acute pain of left shoulder due to trauma - Plan: DG Shoulder Left  >25 minutes spent in face to face time with patient, >50% spent in counselling or coordination of care   Highly suspicious for full-thickness rotator cuff tear.  My best medical advise to  the patient would be to have this further evaluated and if full-thickness rotator cuff tear, consider surgery.  Given his prior experience with rotator cuff repair on his right shoulder, he does not want to pursue advanced imaging or surgical consultation currently.  I think this is not entirely unreasonable.  I began him on the moon shoulder protocol, moderately altered.  He will follow-up with me in 6 weeks, and at that point we will make some further decisions regarding his care.  Follow-up: Return in about 6 weeks (around 02/24/2017).  Future Appointments  Date Time Provider [place]  02/24/2017  8:00 AM Malaky Tetrault, Frederico Hamman, MD [facility]   Medications Discontinued During This Encounter  Medication Reason  . acetaminophen (TYLENOL)  650 MG CR tablet Completed Course  . ibuprofen (ADVIL,MOTRIN) 200 MG tablet Completed Course   Orders Placed This Encounter  Procedures  . DG Shoulder Left    Signed,  Frederico Hamman T. Cache Decoursey, MD   Allergies as of 01/13/2017      Reactions   Amlodipine    Frequent urination   Lisinopril    Dry cough   Pravastatin    GI upset, intolerant      Medication List        Accurate as of 01/13/17 11:40 AM. Always use your most recent med list.          aspirin 81 MG tablet Take 1 tablet (81 mg total) by mouth daily.   clotrimazole-betamethasone cream Commonly known as:  LOTRISONE Apply 1 application topically 2 (two) times daily.   fluticasone 50 MCG/ACT nasal spray Commonly known as:  FLONASE USE 2 SPRAYS IN EACH NOSTRIL DAILY   HYDROcodone-acetaminophen 5-325 MG tablet Commonly known as:  NORCO/VICODIN Take 1 tablet by mouth every 6 (six) hours as needed for moderate pain (sedation caution).   losartan 25 MG tablet Commonly known as:  COZAAR Take 1 tablet (25 mg total) by mouth daily.

## 2017-01-27 ENCOUNTER — Other Ambulatory Visit: Payer: Self-pay | Admitting: *Deleted

## 2017-01-27 MED ORDER — FLUTICASONE PROPIONATE 50 MCG/ACT NA SUSP: 2.0000 | Freq: Every day | NASAL | 5 refills | Status: DC

## 2017-02-24 ENCOUNTER — Ambulatory Visit: Payer: BC Managed Care – PPO | Admitting: Family Medicine

## 2017-02-24 ENCOUNTER — Encounter: Payer: Self-pay | Admitting: Family Medicine

## 2017-02-24 ENCOUNTER — Other Ambulatory Visit: Payer: Self-pay

## 2017-02-24 VITALS — BP 130/78 | HR 53 | Temp 97.9°F | Ht 69.0 in | Wt 217.5 lb

## 2017-02-24 DIAGNOSIS — R29898 Other symptoms and signs involving the musculoskeletal system: Secondary | ICD-10-CM | POA: Diagnosis not present

## 2017-02-24 DIAGNOSIS — M25512 Pain in left shoulder: Secondary | ICD-10-CM

## 2017-02-24 NOTE — Progress Notes (Signed)
Dr. Frederico Hamman T. Callyn Severtson, MD, [facility] [place], [address] Phone: [phone] Fax: [phone]  02/24/2017  Patient: Bryce Villanueva, MRN: [id number], DOB: 1950-09-11, 67 y.o.  Primary Physician:  Tonia Ghent, MD   Chief Complaint  Patient presents with  . Follow-up    Left Shoulder   Subjective:   Bryce Villanueva is a 67 y.o. very pleasant male patient who presents with the following:  DOI 01/02/2017  F/u L shoulder: fell off bulldozer.  The details of his initial injury are below.  His pain has improved compared to how it was 6 weeks ago.  He is having some increase in his motion.  His left shoulder still is quite limited in terms of his strength as well as its range of motion.  Last time I discussed with him that I was concerned he had torn his rotator cuff, but the patient wanted to try some conservative management and give it some time before assessment.  He continues to have dramatic difficulty abducting his shoulder greater than 30 degrees.  01/13/2017 Last OV with Owens Loffler, MD  Patient recently fell off of a bulldozer 11 days ago, and then came down into some loose dirt and fell down an embankment.  He believes that he struck his head and was unconscious for a short amount of time, but came to when his son came to evaluate him.  He is still having some nausea, but he otherwise is improving from his head injury standpoint.  He is primarily here to evaluate and discuss his left-sided shoulder injury.  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 Shellia Carwin distantly.  On the left, he currently has extensive bruising on his left shoulder, and he is only able to elevate his arm about 25 degrees.  He is having some significant pain.  Past Medical History, Surgical History, Social History, Family History, Problem List, Medications, and Allergies have been  reviewed and updated if relevant.  Patient Active Problem List   Diagnosis Date Noted  . Concussion 01/06/2017  . Shoulder pain 01/06/2017  . Rash 01/06/2017  . Advance care planning 08/01/2015  . Routine general medical examination at a health care facility 12/08/2012  . Pain in joint, ankle and foot 06/16/2012  . HSV (herpes simplex virus) infection 05/19/2012  . Allergic rhinitis 05/19/2012  . Essential hypertension, benign 05/19/2012  . HLD (hyperlipidemia) 02/18/2009  . ARTHRITIS, RIGHT ANKLE 02/17/2009    Past Medical History:  Diagnosis Date  . Biceps tendon tear RIGHT ARM  . Diverticulosis of colon   . Hypertension   . Kidney stones   . Labral tear of shoulder     Past Surgical History:  Procedure Laterality Date  . FOOT SURGERY  03/05/09   RIGHT ANKLE  BONE SPUR REMOVAL (Dr. Beola Cord)  . SHOULDER SURGERY Right 2013    Social History   Socioeconomic History  . Marital status: Married    Spouse name: Not on file  . Number of children: 2  . Years of education: Not on file  . Highest education level: Not on file  Social Needs  . Financial resource strain: Not on file  . Food insecurity - worry: Not on file  . Food insecurity - inability: Not on file  . Transportation needs - medical: Not on file  . Transportation needs - non-medical: Not on file  Occupational History  . Occupation: Chartered certified accountant: [employer]  Tobacco Use  . Smoking status: Never Smoker  . Smokeless tobacco: Never Used  Substance and Sexual Activity  . Alcohol use: Yes    Alcohol/week: 0.6 oz    Types: 1 Standard drinks or equivalent per week    Comment: one drink on the weekends  . Drug use: No  . Sexual activity: Not on file  Other Topics Concern  . 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  Emphysema Father        smoker  . Alcohol abuse Father   . Hypertension Brother   . Alcohol abuse Brother   . Hypertension Son   . Hypertension Brother   . Pulmonary embolism Brother   . Prostate cancer Neg Hx   . Colon cancer Neg Hx     Allergies  Allergen Reactions  . Amlodipine     Frequent urination  . Lisinopril     Dry cough  . Pravastatin     GI upset, intolerant    Medication list reviewed and updated in full in [facility].  GEN: No fevers, chills. Nontoxic. Primarily MSK c/o today. MSK: Detailed in the HPI GI: tolerating PO intake without difficulty Neuro: No numbness, parasthesias, or tingling associated. Otherwise the pertinent positives of the ROS are noted above.   Objective:   BP 130/78   Pulse (!) 53   Temp 97.9 F (36.6 C) (Oral)   Ht 5\' 9"  (1.753 m)   Wt 217 lb 8 oz (98.7 kg)   BMI 32.12 kg/m    GEN: WDWN, NAD, Non-toxic, Alert & Oriented x 3 HEENT: Atraumatic, Normocephalic.  Ears and Nose: No external deformity. EXTR: No clubbing/cyanosis/edema NEURO: Normal gait.  PSYCH: Normally interactive. Conversant. Not depressed or anxious appearing.  Calm demeanor.    Nontender along the clavicle as well as at the [facility] joint or in the bicipital groove.  Abduction, maximal abduction actively is 30 degrees.  Strength is 2/5. Flexion strength is 3-/5. Internal and external range of motion is 4 out of 5.  Drop test is positive on the left.  There is a mechanical block developing approximately at 100 degrees and passive abduction.  Radiology: Dg Shoulder Left  Result Date: 01/13/2017 CLINICAL DATA:  Acute left shoulder pain after injury. EXAM: LEFT SHOULDER - 2+ VIEW COMPARISON:  None. FINDINGS: There is no evidence of fracture or dislocation. Mild degenerative changes seen involving the left acromioclavicular joint. Soft tissues are unremarkable. 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 No acute  abnormality seen the left shoulder. Electronically Signed   By: Marijo Conception, M.D.   On: 01/13/2017 10:58   Assessment and Plan:   Acute pain of left shoulder - Plan: MR Shoulder Left Wo Contrast  Shoulder weakness  Strong suspicion for full-thickness rotator cuff tear, multiple components of the rotator cuff or other parts of the shoulder may be involved also.  The patient fell approximately 10 feet off of a bulldozer to sustained this injury.  Drop test is positive.  Markedly abnormal strength exam.  The patient also wanted to discuss conservative care and nonoperative treatment of this injury potentially.  We did review this, and I made it clear that I think that he  would have a permanent disability with his left shoulder, at the very least potentially.  We will obtain an MRI of the left shoulder to evaluate the patient's rotator cuff, biceps tendon, and their integrity.  Follow-up: No Follow-up on file.  Orders Placed This Encounter  Procedures  . MR Shoulder Left Wo Contrast    Signed,  Yoshiye Kraft T. Jadian Karman, MD   Allergies as of 02/24/2017      Reactions   Amlodipine    Frequent urination   Lisinopril    Dry cough   Pravastatin    GI upset, intolerant      Medication List        Accurate as of 02/24/17  2:11 PM. Always use your most recent med list.          aspirin 81 MG tablet Take 1 tablet (81 mg total) by mouth daily.   clotrimazole-betamethasone cream Commonly known as:  LOTRISONE Apply 1 application topically 2 (two) times daily.   desonide 0.05 % cream Commonly known as:  DESOWEN APPLY ON THE SKIN TWICE DAILY AS NEEDED   famciclovir 250 MG tablet Commonly known as:  FAMVIR TAKE 1 TABLET BY MOUTH DAILY AS NEEDED FOR FLARES   fluticasone 50 MCG/ACT nasal spray Commonly known as:  FLONASE Place 2 sprays into both nostrils daily.   losartan 25 MG tablet Commonly known as:  COZAAR Take 1 tablet (25 mg total) by mouth daily.

## 2017-02-24 NOTE — Patient Instructions (Signed)

## 2017-03-05 ENCOUNTER — Inpatient Hospital Stay: Admission: RE | Admit: 2017-03-05 | Payer: Self-pay | Source: Ambulatory Visit

## 2017-03-06 ENCOUNTER — Ambulatory Visit
Admission: RE | Admit: 2017-03-06 | Discharge: 2017-03-06 | Disposition: A | Discharge status: Home / Self Care | Payer: BC Managed Care – PPO | Source: Ambulatory Visit | Attending: Family Medicine | Admitting: Family Medicine

## 2017-03-06 DIAGNOSIS — M25512 Pain in left shoulder: Secondary | ICD-10-CM

## 2017-03-07 ENCOUNTER — Other Ambulatory Visit: Payer: Self-pay | Admitting: Family Medicine

## 2017-03-07 DIAGNOSIS — S46012A Strain of muscle(s) and tendon(s) of the rotator cuff of left shoulder, initial encounter: Secondary | ICD-10-CM

## 2017-04-08 ENCOUNTER — Other Ambulatory Visit: Payer: Self-pay | Admitting: Family Medicine

## 2017-04-08 NOTE — Telephone Encounter (Signed)
Electronic refill request. Lotrisone Cream Last office visit:   02/24/17 [place] Last Filled:  30 g 0 01/05/2017  Please advise.

## 2017-04-10 NOTE — Telephone Encounter (Signed)
Sent. Thanks.   

## 2017-04-19 ENCOUNTER — Ambulatory Visit: Payer: BC Managed Care – PPO | Admitting: Family Medicine

## 2017-04-19 ENCOUNTER — Encounter: Payer: Self-pay | Admitting: Family Medicine

## 2017-04-19 VITALS — BP 128/78 | HR 59 | Temp 98.3°F | Wt 212.5 lb

## 2017-04-19 DIAGNOSIS — M109 Gout, unspecified: Secondary | ICD-10-CM | POA: Diagnosis not present

## 2017-04-19 MED ORDER — COLCHICINE 0.6 MG PO TABS
0.6000 mg | ORAL_TABLET | Freq: Every day | ORAL | 0 refills | Status: DC | PRN
Start: 2017-04-19 — End: 2017-05-11

## 2017-04-19 MED ORDER — TRAMADOL HCL 50 MG PO TABS: 50.0000 mg | ORAL_TABLET | Freq: Two times a day (BID) | ORAL | 0 refills | Status: DC | PRN

## 2017-04-19 NOTE — Assessment & Plan Note (Addendum)
Of L foot. L knee symptoms have largely resolved. Discussed limited options for intervention - avoid NSAIDs and steroid given recent shoulder surgery - but I did ask him to check with Dr Tamera Punt when we can start these (appt in 2 days). Will treat for now with colchicine course 1 tab PRN gout flare, Rx tramadol for breakthrough pain. Discussed colchicine cost, provided with info on goodRx for cheapest alternatives.  I asked him to let us know if cleared by ortho for NSAID or steroid course.

## 2017-04-19 NOTE — Progress Notes (Signed)
BP 128/78   Pulse (!) 59   Temp 98.3 F (36.8 C) (Oral)   Wt 212 lb 8 oz (96.4 kg)   SpO2 98%   BMI 31.38 kg/m    CC: toe pain Subjective:    Patient ID: Bryce Villanueva, male    DOB: 1950/03/12, 67 y.o.   MRN: [id number]  HPI: Bryce Villanueva is a 66 y.o. male presenting on 04/19/2017 for Toe Pain (Hx of gout)   2 wks post op shoulder surgery Tamera Punt). Upcoming appt Thursday to get stitches out. Completed his post op opiate course.   Recent L knee gout flare - feeling better but now having pain to L foot at 1st MTPJ for last 3-4 days. Treating with celery seed and tylenol.   Tired of hurting.  Rare gout flares - one every few years.  Following gout diet.   Relevant past medical, surgical, family and social history reviewed and updated as indicated. Interim medical history since our last visit reviewed. Allergies and medications reviewed and updated. Outpatient Medications Prior to Visit  Medication Sig Dispense Refill  . aspirin 81 MG tablet Take 1 tablet (81 mg total) by mouth daily.    . clotrimazole-betamethasone (LOTRISONE) cream APPLY TO AFFECTED AREA TWICE A DAY 30 g 0  . desonide (DESOWEN) 0.05 % cream APPLY ON THE SKIN TWICE DAILY AS NEEDED  2  . famciclovir (FAMVIR) 250 MG tablet TAKE 1 TABLET BY MOUTH DAILY AS NEEDED FOR FLARES  1  . fluticasone (FLONASE) 50 MCG/ACT nasal spray Place 2 sprays into both nostrils daily. 16 g 5  . losartan (COZAAR) 25 MG tablet Take 1 tablet (25 mg total) by mouth daily. 90 tablet 3   No facility-administered medications prior to visit.      Per HPI unless specifically indicated in ROS section below Review of Systems     Objective:    BP 128/78   Pulse (!) 59   Temp 98.3 F (36.8 C) (Oral)   Wt 212 lb 8 oz (96.4 kg)   SpO2 98%   BMI 31.38 kg/m   Wt Readings from Last 3 Encounters:  04/19/17 212 lb 8 oz (96.4 kg)  02/24/17 217 lb 8 oz (98.7 kg)  01/13/17 214 lb 4 oz (97.2 kg)    Physical Exam  Constitutional: He  appears well-developed and well-nourished. No distress.  Musculoskeletal: He exhibits no edema.  L foot - 2+ DP. Marked tenderness with mild redness and swelling at 1st MTPJ  Neurological: He is alert.  Skin: Skin is warm and dry. No rash noted. There is erythema.  Nursing note and vitals reviewed.  No results found for: [facility]  Lab Results  Component Value Date   CREATININE 1.35 09/03/2016   BUN 21 09/03/2016   NA 138 09/03/2016   K 4.1 09/03/2016   CL 103 09/03/2016   CO2 28 09/03/2016       Assessment & Plan:   Problem List Items Addressed This Visit    Podagra - Primary    Of L foot. L knee symptoms have largely resolved. Discussed limited options for intervention - avoid NSAIDs and steroid given recent shoulder surgery - but I did ask him to check with Dr Tamera Punt when we can start these (appt in 2 days). Will treat for now with colchicine course 1 tab PRN gout flare, Rx tramadol for breakthrough pain. Discussed colchicine cost, provided with info on goodRx for cheapest alternatives.  I asked him to let us  know if cleared by ortho for NSAID or steroid course.       Relevant Medications   colchicine 0.6 MG tablet   traMADol (ULTRAM) 50 MG tablet       Meds ordered this encounter  Medications  . colchicine 0.6 MG tablet    Sig: Take 1 tablet (0.6 mg total) by mouth daily as needed.    Dispense:  30 tablet    Refill:  0  . traMADol (ULTRAM) 50 MG tablet    Sig: Take 1 tablet (50 mg total) by mouth 2 (two) times daily as needed for moderate pain (gout flare).    Dispense:  20 tablet    Refill:  0   No orders of the defined types were placed in this encounter.   Follow up plan: Return if symptoms worsen or fail to improve.  Ria Bush, MD

## 2017-04-19 NOTE — Patient Instructions (Addendum)
You do have gout flare - podagra. Treat with colchicine 0.6mg  daily as needed.  May take tramadol pain medicine as needed for pain.  Ask Dr Tamera Punt on Thursday if you can now take anti inflammatory or steroid course for gout flare and let me know what he says.   Gout Gout is painful swelling that can happen in some of your joints. Gout is a type of arthritis. This condition is caused by having too much uric acid in your body. Uric acid is a chemical that is made when your body breaks down substances called purines. If your body has too much uric acid, sharp crystals can form and build up in your joints. This causes pain and swelling. Gout attacks can happen quickly and be very painful (acute gout). Over time, the attacks can affect more joints and happen more often (chronic gout). Follow these instructions at home: During a Gout Attack  If directed, put ice on the painful area: ? Put ice in a plastic bag. ? Place a towel between your skin and the bag. ? Leave the ice on for 20 minutes, 2-3 times a day.  Rest the joint as much as possible. If the joint is in your leg, you may be given crutches to use.  Raise (elevate) the painful joint above the level of your heart as often as you can.  Drink enough fluids to keep your pee (urine) clear or pale yellow.  Take over-the-counter and prescription medicines only as told by your doctor.  Do not drive or use heavy machinery while taking prescription pain medicine.  Follow instructions from your doctor about what you can or cannot eat and drink.  Return to your normal activities as told by your doctor. Ask your doctor what activities are safe for you. Avoiding Future Gout Attacks  Follow a low-purine diet as told by a specialist (dietitian) or your doctor. Avoid foods and drinks that have a lot of purines, such as: ? Liver. ? Kidney. ? Anchovies. ? Asparagus. ? Herring. ? Mushrooms ? Mussels. ? Beer.  Limit alcohol intake to no more  than 1 drink a day for nonpregnant women and 2 drinks a day for men. One drink equals 12 oz of beer, 5 oz of wine, or 1 oz of hard liquor.  Stay at a healthy weight or lose weight if you are overweight. If you want to lose weight, talk with your doctor. It is important that you do not lose weight too fast.  Start or continue an exercise plan as told by your doctor.  Drink enough fluids to keep your pee clear or pale yellow.  Take over-the-counter and prescription medicines only as told by your doctor.  Keep all follow-up visits as told by your doctor. This is important. Contact a doctor if:  You have another gout attack.  You still have symptoms of a gout attack after10 days of treatment.  You have problems (side effects) because of your medicines.  You have chills or a fever.  You have burning pain when you pee (urinate).  You have pain in your lower back or belly. Get help right away if:  You have very bad pain.  Your pain cannot be controlled.  You cannot pee. This information is not intended to replace advice given to you by your health care provider. Make sure you discuss any questions you have with your health care provider. Document Released: 10/21/2007 Document Revised: 06/19/2015 Document Reviewed: 10/24/2014 Elsevier Interactive Patient Education  2018  [employer].

## 2017-04-21 ENCOUNTER — Telehealth: Payer: Self-pay | Admitting: Family Medicine

## 2017-04-21 NOTE — Telephone Encounter (Signed)
Glad he's doing better.

## 2017-04-21 NOTE — Telephone Encounter (Signed)
Pt saw Dr Darnell Level on 04/19/17.

## 2017-04-21 NOTE — Telephone Encounter (Signed)
Copied from [place]. Topic: Quick Communication - See Telephone Encounter >> Apr 21, 2017  9:12 AM Ether Griffins B wrote: CRM for notification. See Telephone encounter for: 04/21/17. Pt calling in to let Dr. Danise Mina know that he spoke with his surgeon yesterday and for him to not take any anti inflammatory medication or steroids. He is doing just fine and the medication Dr. Darnell Level gave him is working great.

## 2017-05-11 ENCOUNTER — Other Ambulatory Visit: Payer: Self-pay | Admitting: Family Medicine

## 2017-06-11 ENCOUNTER — Other Ambulatory Visit: Payer: Self-pay | Admitting: Family Medicine

## 2017-06-13 NOTE — Telephone Encounter (Signed)
Electronic refill request Last refill 05/11/17 #30 Last office visit 04/19/17

## 2017-06-14 NOTE — Telephone Encounter (Signed)
Spoke to patient and was advised that he did not request the refill and it must have been an automatic refill. Patient stated that he will notify the pharmacy to take this off of automatic refill. Patient stated that he is doing just fine and will let Dr. Damita Dunnings know if he has another problem.

## 2017-06-14 NOTE — Telephone Encounter (Signed)
Sent. If needed frequently then needs follow-up appointment.  Thanks.

## 2017-06-14 NOTE — Telephone Encounter (Signed)
Patient advised and states that was on auto-refill and it has cleared up and he doesn't even need it now.

## 2017-06-14 NOTE — Telephone Encounter (Signed)
Thanks

## 2017-07-30 ENCOUNTER — Other Ambulatory Visit: Payer: Self-pay | Admitting: Family Medicine

## 2017-08-18 ENCOUNTER — Other Ambulatory Visit: Payer: Self-pay | Admitting: Family Medicine

## 2017-08-18 ENCOUNTER — Encounter: Payer: Self-pay | Admitting: *Deleted

## 2017-10-16 ENCOUNTER — Other Ambulatory Visit: Payer: Self-pay | Admitting: Family Medicine

## 2017-10-16 DIAGNOSIS — I10 Essential (primary) hypertension: Secondary | ICD-10-CM

## 2017-10-17 ENCOUNTER — Other Ambulatory Visit (INDEPENDENT_AMBULATORY_CARE_PROVIDER_SITE_OTHER): Payer: PPO

## 2017-10-17 DIAGNOSIS — I10 Essential (primary) hypertension: Secondary | ICD-10-CM

## 2017-10-17 LAB — LIPID PANEL
Cholesterol: 241 mg/dL — ABNORMAL HIGH (ref 0–200)
HDL: 41.2 mg/dL (ref >39.00)
LDL Cholesterol: 162 mg/dL — ABNORMAL HIGH (ref 0–99)
NonHDL: 199.55
TRIGLYCERIDES: 186 mg/dL — AB (ref 0.0–149.0)
Total CHOL/HDL Ratio: 6
VLDL: 37.2 mg/dL (ref 0.0–40.0)

## 2017-10-17 LAB — COMPREHENSIVE METABOLIC PANEL
ALT: 19 U/L (ref 0–53)
AST: 22 U/L (ref 0–37)
Albumin: 4.6 g/dL (ref 3.5–5.2)
Alkaline Phosphatase: 57 U/L (ref 39–117)
BILIRUBIN TOTAL: 0.6 mg/dL (ref 0.2–1.2)
BUN: 23 mg/dL (ref 6–23)
CALCIUM: 10 mg/dL (ref 8.4–10.5)
CO2: 30 meq/L (ref 19–32)
CREATININE: 1.37 mg/dL (ref 0.40–1.50)
Chloride: 101 mEq/L (ref 96–112)
GFR: 55.41 mL/min — ABNORMAL LOW (ref >60.00)
Glucose, Bld: 98 mg/dL (ref 70–99)
Potassium: 4.3 mEq/L (ref 3.5–5.1)
Sodium: 138 mEq/L (ref 135–145)
TOTAL PROTEIN: 7.8 g/dL (ref 6.0–8.3)

## 2017-10-24 ENCOUNTER — Encounter: Payer: Self-pay | Admitting: Family Medicine

## 2017-10-24 ENCOUNTER — Ambulatory Visit (INDEPENDENT_AMBULATORY_CARE_PROVIDER_SITE_OTHER): Payer: PPO | Admitting: Family Medicine

## 2017-10-24 VITALS — BP 126/70 | HR 54 | Temp 97.7°F | Ht 69.0 in | Wt 222.0 lb

## 2017-10-24 DIAGNOSIS — B009 Herpesviral infection, unspecified: Secondary | ICD-10-CM

## 2017-10-24 DIAGNOSIS — Z7189 Other specified counseling: Secondary | ICD-10-CM

## 2017-10-24 DIAGNOSIS — Z23 Encounter for immunization: Secondary | ICD-10-CM

## 2017-10-24 DIAGNOSIS — I10 Essential (primary) hypertension: Secondary | ICD-10-CM

## 2017-10-24 DIAGNOSIS — Z0000 Encounter for general adult medical examination without abnormal findings: Secondary | ICD-10-CM

## 2017-10-24 MED ORDER — LOSARTAN POTASSIUM 25 MG PO TABS: 25.0000 mg | ORAL_TABLET | Freq: Every day | ORAL | 3 refills | Status: DC

## 2017-10-24 MED ORDER — FAMCICLOVIR 250 MG PO TABS: ORAL_TABLET | ORAL | 1 refills | Status: DC

## 2017-10-24 NOTE — Patient Instructions (Signed)
Don't change your meds.   Keep exercising.  Update me as needed.  Take care.  Glad to see you.

## 2017-10-24 NOTE — Assessment & Plan Note (Signed)
Statin intolerant.  Continue work on diet and exercise.  No change in meds.  If he gets lightheaded at all then stop losartan and update me as needed.  He agrees.

## 2017-10-24 NOTE — Assessment & Plan Note (Signed)
Continue famciclovir as needed.

## 2017-10-24 NOTE — Progress Notes (Signed)
I have personally reviewed the Medicare Annual Wellness questionnaire and have noted 1. The patient's medical and social history 2. Their use of alcohol, tobacco or illicit drugs 3. Their current medications and supplements 4. The patient's functional ability including ADL's, fall risks, home safety risks and hearing or visual             impairment. 5. Diet and physical activities 6. Evidence for depression or mood disorders  The patients weight, height, BMI have been recorded in the chart and visual acuity is per eye clinic.  I have made referrals, counseling and provided education to the patient based review of the above and I have provided the pt with a written personalized care plan for preventive services.  Provider list updated- see scanned forms.  Routine anticipatory guidance given to patient.  See health maintenance. The possibility exists that previously documented standard health maintenance information may have been brought forward from a previous encounter into this note.  If needed, that same information has been updated to reflect the current situation based on today's encounter.   Tetanus 2017 Flu shot 10/17/17 Shingles out of stock.   PNA d/w pt.   Prostate cancer screening and PSA options (with potential risks and benefits of testing vs not testing) were discussed along with recent recs/guidelines. He declined testing PSA at this point. D/w patient [id number] for colon cancer screening, including IFOB vs. Cologuard neg 2018.   Cognitive function addressed- see scanned forms- and if abnormal then additional documentation follows.  Living will d/w pt. Wife would be designated if patient were incapacitated.  Diet and exercise d/w pt.   [facility]   Hearing screen passed, whisper level hearing intact.  EKG noted.  Unremarkable.  Discussed with patient.  Bradycardia likely due to relative at this level.  His L shoulder is stiff  but improved after surgery.  He is working on ROM.  D/w pt.  He is exercising on treadmill,  He worked up to 40 minutes to go 2 miles w/o troubles now.  His fitness level is improving.   Hypertension:    Using medication without problems or lightheadedness: yes Chest pain with exertion:no Edema:no Short of breath:no Labs d/w pt.   Statin intolerant.    Using famciclovir as needed when he has a flare of symptoms.  No adverse effect on medication.  His wife finished chemo and has f/u with onc pending for today.    PMH and SH reviewed  Meds, vitals, and allergies reviewed.   ROS: Per HPI.  Unless specifically indicated otherwise in HPI, the patient denies:  General: fever. Eyes: acute vision changes ENT: sore throat Cardiovascular: chest pain Respiratory: SOB GI: vomiting GU: dysuria Musculoskeletal: acute back pain Derm: acute rash Neuro: acute motor dysfunction Psych: worsening mood Endocrine: polydipsia Heme: bleeding Allergy: hayfever  GEN: nad, alert and oriented HEENT: mucous membranes moist NECK: supple w/o LA CV: rrr. PULM: ctab, no inc wob ABD: soft, +bs EXT: no edema SKIN: no acute rash

## 2017-10-24 NOTE — Assessment & Plan Note (Signed)
Tetanus 2017 Flu shot 10/17/17 Shingles out of stock.   PNA d/w pt.   Prostate cancer screening and PSA options (with potential risks and benefits of testing vs not testing) were discussed along with recent recs/guidelines. He declined testing PSA at this point. D/w patient [id number] for colon cancer screening, including IFOB vs. Cologuard neg 2018.   Cognitive function addressed- see scanned forms- and if abnormal then additional documentation follows.  Living will d/w pt. Wife would be designated if patient were incapacitated.  Diet and exercise d/w pt.   [facility] and HCV [facility].

## 2017-10-24 NOTE — Assessment & Plan Note (Signed)
Wife designated if patient were incapacitated.  

## 2017-12-09 ENCOUNTER — Other Ambulatory Visit: Payer: Self-pay | Admitting: Family Medicine

## 2017-12-26 DIAGNOSIS — L814 Other melanin hyperpigmentation: Secondary | ICD-10-CM | POA: Diagnosis not present

## 2017-12-26 DIAGNOSIS — L820 Inflamed seborrheic keratosis: Secondary | ICD-10-CM | POA: Diagnosis not present

## 2017-12-26 DIAGNOSIS — L821 Other seborrheic keratosis: Secondary | ICD-10-CM | POA: Diagnosis not present

## 2017-12-26 DIAGNOSIS — B0089 Other herpesviral infection: Secondary | ICD-10-CM | POA: Diagnosis not present

## 2017-12-26 DIAGNOSIS — L218 Other seborrheic dermatitis: Secondary | ICD-10-CM | POA: Diagnosis not present

## 2018-01-02 IMAGING — DX DG SHOULDER 2+V*L*
3 series · 3 of 3 positions shown · non-contrast
Comparison: None.

CLINICAL DATA: Acute left shoulder pain after injury.

EXAM:
LEFT SHOULDER - 2+ VIEW

[shoulder axial]
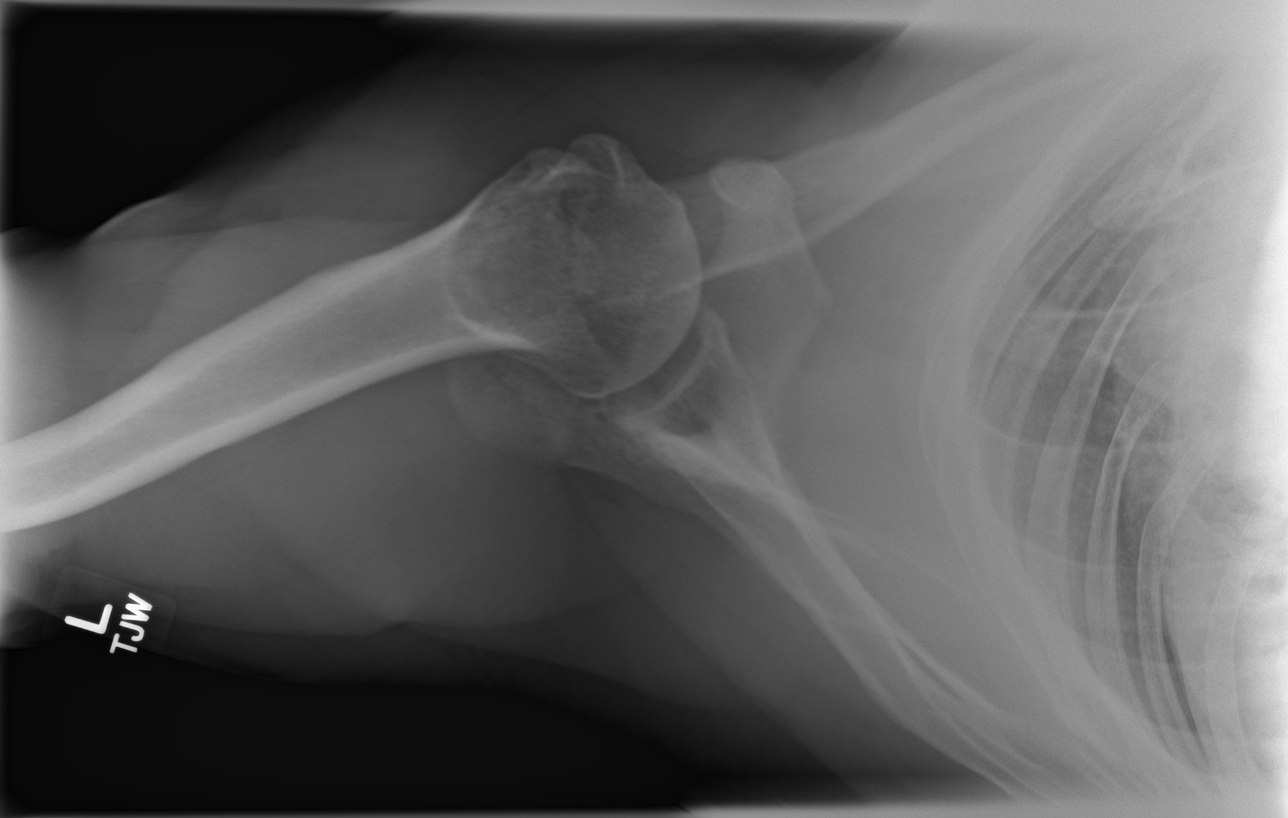

[shoulder ap]
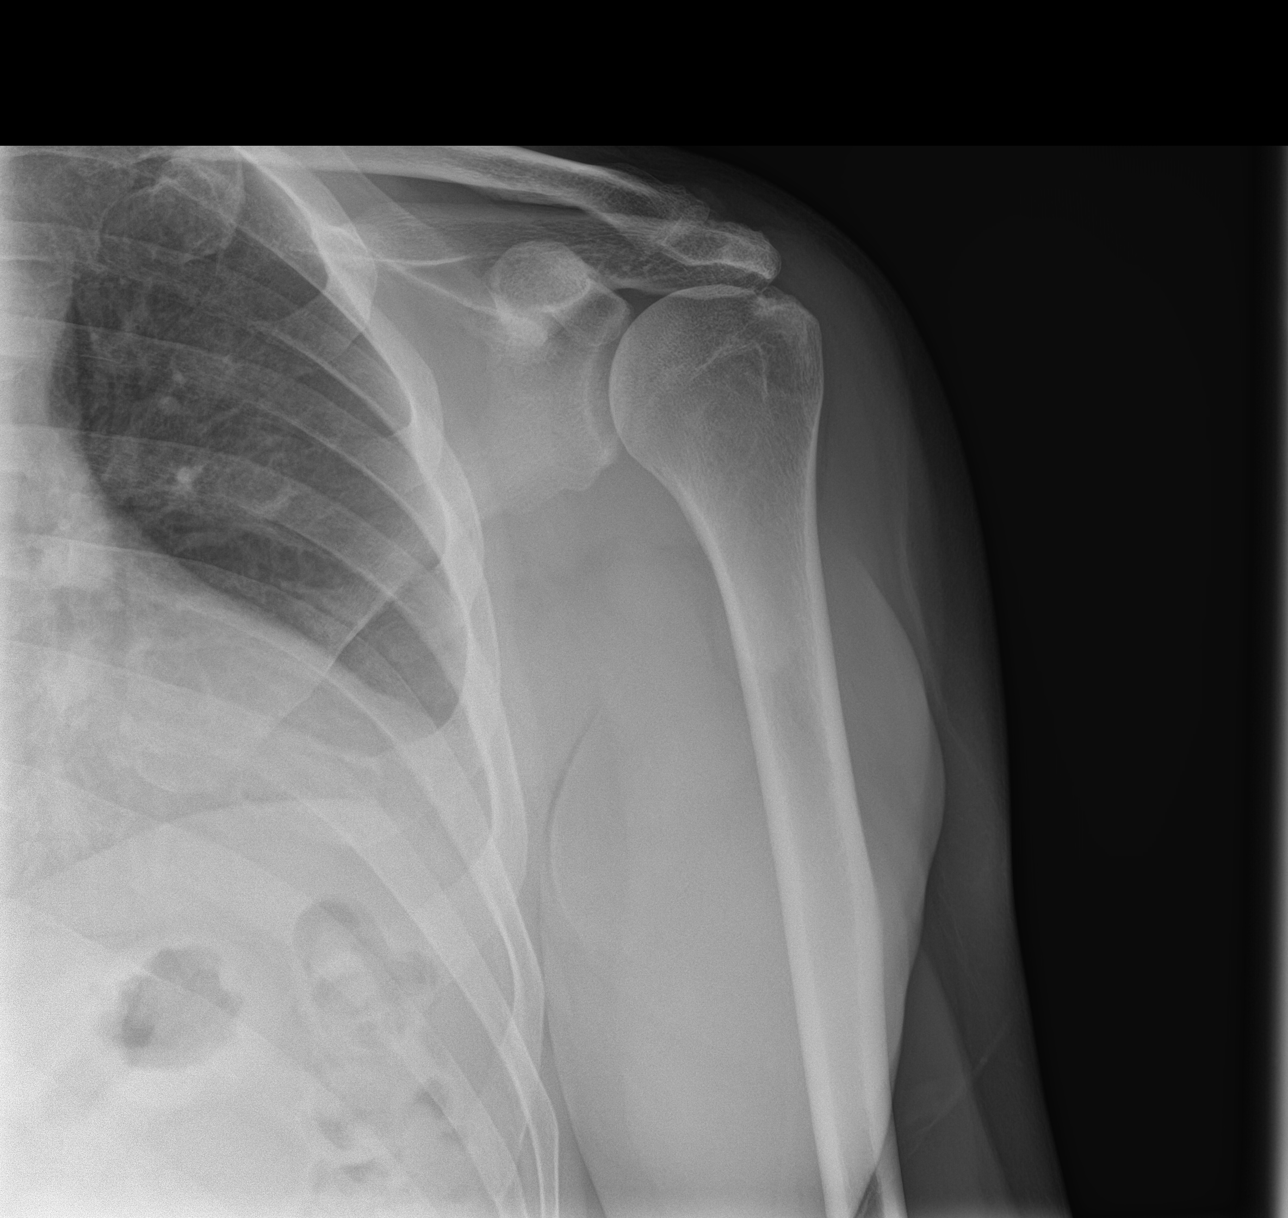

[shoulder y-view]
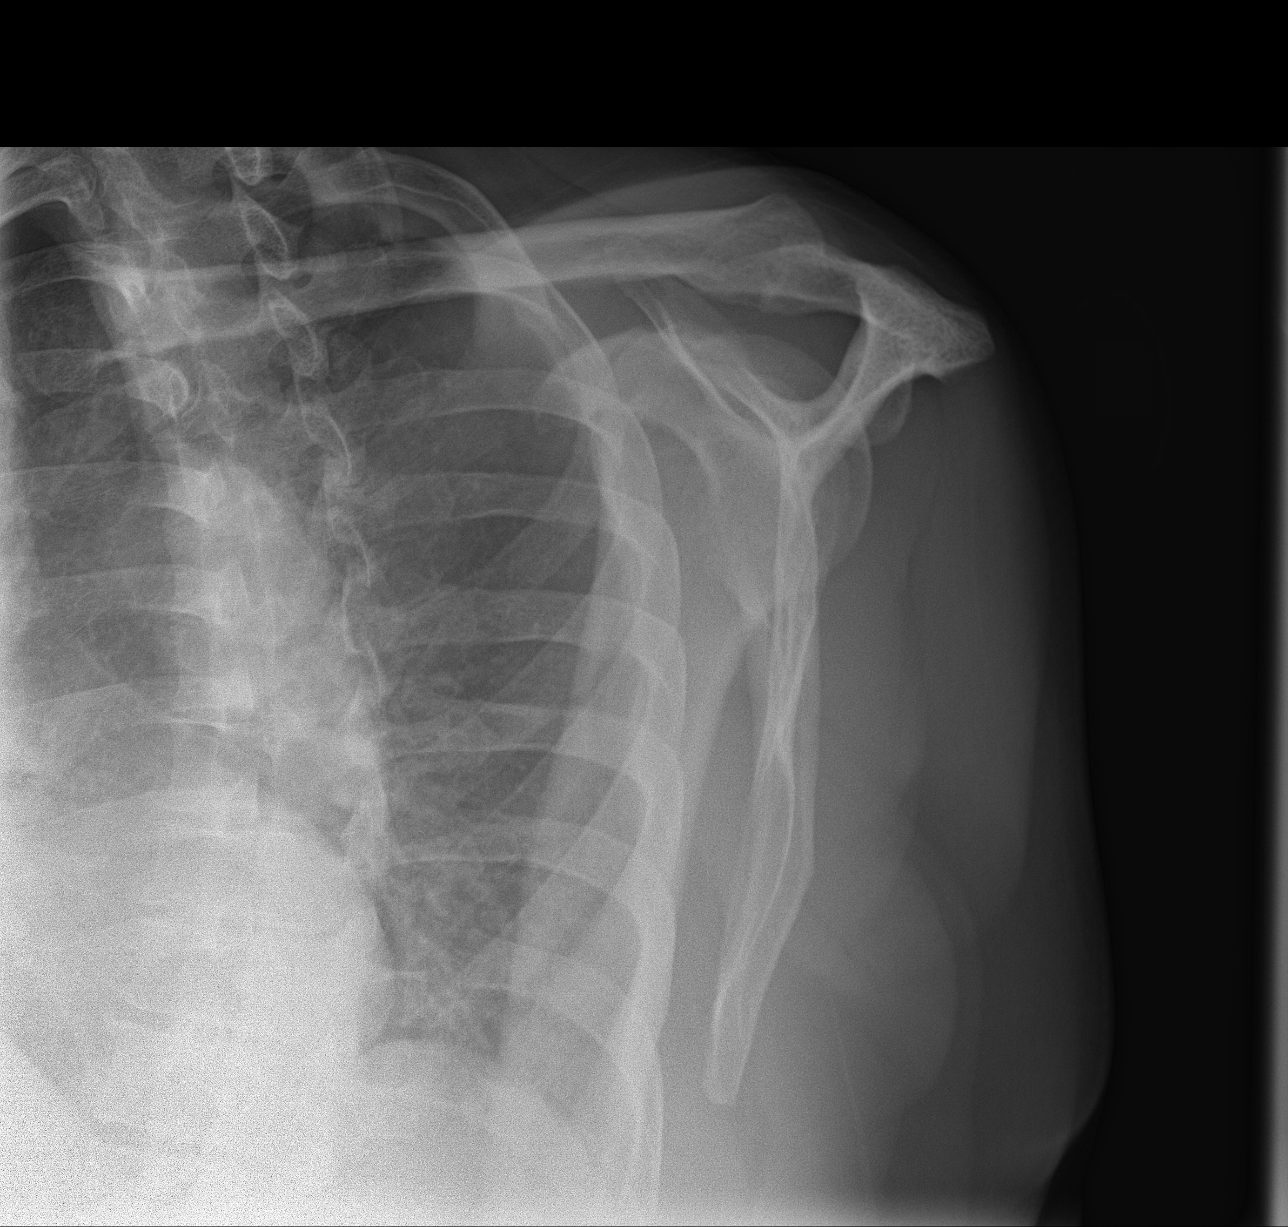

[3 of 3 positions shown; findings below may reference images not displayed]

FINDINGS: There is no evidence of fracture or dislocation. Mild degenerative
changes seen involving the left acromioclavicular joint. Soft
tissues are unremarkable.
IMPRESSION: Mild degenerative joint disease of the left acromioclavicular joint.
No acute abnormality seen the left shoulder.

## 2018-01-03 ENCOUNTER — Other Ambulatory Visit: Payer: Self-pay | Admitting: Family Medicine

## 2018-01-03 NOTE — Telephone Encounter (Signed)
Electronic refill request. Famvir Last office visit:   10/24/17 Last Filled:    30 tablet 1 12/09/2017  Please advise.

## 2018-01-04 NOTE — Telephone Encounter (Signed)
Sent. Thanks.   

## 2018-01-09 ENCOUNTER — Other Ambulatory Visit: Payer: Self-pay | Admitting: Family Medicine

## 2018-01-30 ENCOUNTER — Other Ambulatory Visit: Payer: Self-pay | Admitting: Family Medicine

## 2018-01-30 NOTE — Telephone Encounter (Signed)
Electronic refill request. Famciclovir Last office visit:   10/24/17 [facility] Last Filled:    30 tablet 1 01/04/2018  Please advise.

## 2018-01-31 NOTE — Telephone Encounter (Signed)
Sent. Thanks.   

## 2018-06-20 DIAGNOSIS — H35033 Hypertensive retinopathy, bilateral: Secondary | ICD-10-CM | POA: Diagnosis not present

## 2018-06-20 DIAGNOSIS — H0102A Squamous blepharitis right eye, upper and lower eyelids: Secondary | ICD-10-CM | POA: Diagnosis not present

## 2018-06-20 DIAGNOSIS — H2513 Age-related nuclear cataract, bilateral: Secondary | ICD-10-CM | POA: Diagnosis not present

## 2018-06-20 DIAGNOSIS — H0102B Squamous blepharitis left eye, upper and lower eyelids: Secondary | ICD-10-CM | POA: Diagnosis not present

## 2018-06-20 DIAGNOSIS — H1045 Other chronic allergic conjunctivitis: Secondary | ICD-10-CM | POA: Diagnosis not present

## 2018-06-20 DIAGNOSIS — D23112 Other benign neoplasm of skin of right lower eyelid, including canthus: Secondary | ICD-10-CM | POA: Diagnosis not present

## 2018-06-20 DIAGNOSIS — H11153 Pinguecula, bilateral: Secondary | ICD-10-CM | POA: Diagnosis not present

## 2018-06-22 ENCOUNTER — Other Ambulatory Visit: Payer: Self-pay | Admitting: Family Medicine

## 2018-08-25 ENCOUNTER — Other Ambulatory Visit: Payer: Self-pay | Admitting: Family Medicine

## 2018-10-09 ENCOUNTER — Other Ambulatory Visit: Payer: Self-pay

## 2018-10-09 DIAGNOSIS — Z20822 Contact with and (suspected) exposure to covid-19: Secondary | ICD-10-CM

## 2018-10-09 DIAGNOSIS — R6889 Other general symptoms and signs: Secondary | ICD-10-CM | POA: Diagnosis not present

## 2018-10-11 ENCOUNTER — Ambulatory Visit (INDEPENDENT_AMBULATORY_CARE_PROVIDER_SITE_OTHER): Payer: PPO | Admitting: Family Medicine

## 2018-10-11 ENCOUNTER — Telehealth: Payer: Self-pay

## 2018-10-11 ENCOUNTER — Encounter: Payer: Self-pay | Admitting: Family Medicine

## 2018-10-11 VITALS — BP 143/89 | HR 80 | Temp 97.7°F | Ht 69.0 in | Wt 220.0 lb

## 2018-10-11 DIAGNOSIS — J988 Other specified respiratory disorders: Secondary | ICD-10-CM

## 2018-10-11 DIAGNOSIS — R05 Cough: Secondary | ICD-10-CM

## 2018-10-11 DIAGNOSIS — U071 COVID-19: Secondary | ICD-10-CM | POA: Diagnosis not present

## 2018-10-11 DIAGNOSIS — R059 Cough, unspecified: Secondary | ICD-10-CM

## 2018-10-11 LAB — NOVEL CORONAVIRUS, NAA: SARS-CoV-2, NAA: DETECTED — AB

## 2018-10-11 MED ORDER — GUAIFENESIN-CODEINE 100-10 MG/5ML PO SYRP: 5.0000 mL | ORAL_SOLUTION | Freq: Three times a day (TID) | ORAL | 0 refills | Status: DC | PRN

## 2018-10-11 NOTE — Progress Notes (Signed)
Virtual Visit via Video Note  I connected with [name] on 10/11/18 at 11:30 AM EDT by a video enabled telemedicine application and verified that I am speaking with the correct person using two identifiers.  Location: Patient: In his home Provider: [place]   I discussed the limitations of evaluation and management by telemedicine and the availability of in person appointments. The patient expressed understanding and agreed to proceed.  History of Present Illness: Chief Complaint  Patient presents with  . covid    Pt tested positive for Covid-19. Pt c/o coughing and weakness x 9 days. requesting cough medication. Fever x 3 days, resvolved, chills and sweats.    This is a 68 year old male with the above chief complaint.  Patient was at [facility] approximately 10 days ago when his symptoms began with fever and chills.  He experienced fever for 3 days and has not experienced any since.  He continues to have headache, cough that is mostly dry with some morning sputum, loss of smell and taste.  His shortness of breath has improved.  He feels very fatigued.  He has been using an old albuterol inhaler with improvement of breathing.  He is mostly concerned about constant cough and fatigue.  He has not been taking anything for cough. Wife tested negative for COVID.  Was research outside this morning continue to look at the temperature was beautiful yesterday I sat outside on my patio did not work and so discouraging at home yes   Past Medical History:  Diagnosis Date  . Biceps tendon tear RIGHT ARM  . Diverticulosis of colon   . Hypertension   . Kidney stones   . Labral tear of shoulder    Past Surgical History:  Procedure Laterality Date  . FOOT SURGERY  03/05/09   RIGHT ANKLE  BONE SPUR REMOVAL (Dr. Beola Cord)  . SHOULDER SURGERY Right 2013   Family History  Problem Relation Age of Onset  . Emphysema Father        smoker  . Alcohol abuse Father   . Hypertension Brother    . Alcohol abuse Brother   . Hypertension Son   . Hypertension Brother   . Pulmonary embolism Brother   . Liver cancer Sister   . Prostate cancer Neg Hx   . Colon cancer Neg Hx    Social History   Tobacco Use  . Smoking status: Never Smoker  . Smokeless tobacco: Never Used  Substance Use Topics  . Alcohol use: Yes    Alcohol/week: 1.0 standard drinks    Types: 1 Standard drinks or equivalent per week    Comment: one drink on the weekends  . Drug use: No    Observations/Objective: Patient is alert and answers questions appropriately.  Visible skin is unremarkable.  He is normally conversive and does not appear short of breath.  No audible wheeze.  Occasional witnessed dry cough.  Mood and affect are appropriate. BP (!) 143/89 (BP Location: Left Arm, Patient Position: Sitting, Cuff Size: Normal)   Pulse 80   Temp 97.7 F (36.5 C) (Temporal)   Ht 5\' 9"  (1.753 m)   Wt 220 lb (99.8 kg)   BMI 32.49 kg/m  Wt Readings from Last 3 Encounters:  10/11/18 220 lb (99.8 kg)  10/24/17 222 lb (100.7 kg)  04/19/17 212 lb 8 oz (96.4 kg)    Assessment and Plan: 1. COVID-19 -Discussed course of illness and encourage patient to continue symptomatic treatment -Reviewed return to clinic/ER/UC  precautions - guaiFENesin-codeine (ROBITUSSIN AC) 100-10 MG/5ML syrup; Take 5 mLs by mouth 3 (three) times daily as needed for cough.  Dispense: 118 mL; Refill: 0 -My chart COVID home monitoring was ordered  2. Cough - guaiFENesin-codeine (ROBITUSSIN AC) 100-10 MG/5ML syrup; Take 5 mLs by mouth 3 (three) times daily as needed for cough.  Dispense: 118 mL; Refill: 0   Clarene Reamer, FNP-BC  [facility], [facility]  10/11/2018 12:41 PM   Follow Up Instructions: Visit recap sent to patient via my chart.   I discussed the assessment and treatment plan with the patient. The patient was provided an opportunity to ask questions and all were answered. The  patient agreed with the plan and demonstrated an understanding of the instructions.   The patient was advised to call back or seek an in-person evaluation if the symptoms worsen or if the condition fails to improve as anticipated.   Elby Beck, FNP

## 2018-10-11 NOTE — Telephone Encounter (Signed)
Pt left v/m; pt just notified + covid virus; pt is coughing all the time and is weak; request cough med and steroid. I spoke with pt and scheduled virtual appt today at 11:30 with Glenda Chroman FNP. Pt having a dry cough almost all the time and pt is very weak. Pt has been sick since 10/07/18. Pt is self quarantining; pt has been using pro air HFA inhaler filled on 03/2018 not sure who provider is that filled med and that has helped breathing.  Pt went to [facility] last wk. Ed precautions given.pt will get vital signs now.

## 2018-10-11 NOTE — Telephone Encounter (Signed)
Noted  

## 2018-10-13 ENCOUNTER — Telehealth: Payer: Self-pay | Admitting: *Deleted

## 2018-10-13 NOTE — Telephone Encounter (Signed)
I would avoid nsaids given the abdominal pain.  If passing any blood, then to ER.    Omeprazole 20mg  is over the counter and he can try that daily in the meantime.  If abd pain continues, then let me know.  Update [place] as needed o/w.   Thanks.

## 2018-10-13 NOTE — Telephone Encounter (Signed)
Called patient to follow-up on him because of a covid 19 postive result. Patient stated that he is doing a lot better.  Patient stated that he is real weak, has a productive cough/phelm light yellow, but feel that is from sinus drainage. Patient stated that he does not have a fever, no SOB, no difficulty breathing or diarrhea. Patient stated that he has been having some abdominal pain that wakes him up at night and he gets up and takes some Pepto-bismol and is able to go back to sleep. Patient stated that this has been going for about 2 weeks. Patient stated that it feels like an ulcer. Patient stated that he has not had any bleeding. Advised patient if he develops SOB or difficulty breathing he should go to the ER and he verbalized understanding.  Pharmacy [facility]

## 2018-10-13 NOTE — Telephone Encounter (Signed)
Patient advised.

## 2018-10-18 ENCOUNTER — Telehealth: Payer: Self-pay

## 2018-10-18 NOTE — Telephone Encounter (Signed)
Patient reports that he is doing "great!".  He is walking around outside now trying to build his energy up.  The drainage and congestion is almost gone and he has no further issues with abdominal discomfort as was reported on 9/18 f/u call.   He thanks [place] for the check-in calls and says that we can go ahead and remove him from the check in call list.    Patient knows he can contact us if any changes or concerns but I have removed from the [facility] call list at this point.

## 2018-10-18 NOTE — Telephone Encounter (Signed)
Noted. Thanks.

## 2018-10-19 ENCOUNTER — Other Ambulatory Visit: Payer: Self-pay | Admitting: Family Medicine

## 2018-10-31 ENCOUNTER — Ambulatory Visit: Payer: PPO

## 2018-11-19 ENCOUNTER — Other Ambulatory Visit: Payer: Self-pay | Admitting: Family Medicine

## 2018-11-21 ENCOUNTER — Other Ambulatory Visit: Payer: Self-pay | Admitting: Family Medicine

## 2018-11-21 DIAGNOSIS — I10 Essential (primary) hypertension: Secondary | ICD-10-CM

## 2018-11-29 ENCOUNTER — Telehealth: Payer: Self-pay

## 2018-11-29 NOTE — Telephone Encounter (Signed)
LVM to call clinic, pt needs COVID screen, front door and back lab info 11.4.2020 TLJ

## 2018-12-01 ENCOUNTER — Other Ambulatory Visit: Payer: Self-pay

## 2018-12-01 ENCOUNTER — Other Ambulatory Visit (INDEPENDENT_AMBULATORY_CARE_PROVIDER_SITE_OTHER): Payer: PPO

## 2018-12-01 DIAGNOSIS — I10 Essential (primary) hypertension: Secondary | ICD-10-CM

## 2018-12-01 LAB — LIPID PANEL
Cholesterol: 239 mg/dL — ABNORMAL HIGH (ref 0–200)
HDL: 44.9 mg/dL
LDL Cholesterol: 162 mg/dL — ABNORMAL HIGH (ref 0–99)
NonHDL: 194.59
Total CHOL/HDL Ratio: 5
Triglycerides: 165 mg/dL — ABNORMAL HIGH (ref 0.0–149.0)
VLDL: 33 mg/dL (ref 0.0–40.0)

## 2018-12-01 LAB — COMPREHENSIVE METABOLIC PANEL WITH GFR
ALT: 20 U/L (ref 0–53)
AST: 24 U/L (ref 0–37)
Albumin: 4.8 g/dL (ref 3.5–5.2)
Alkaline Phosphatase: 56 U/L (ref 39–117)
BUN: 23 mg/dL (ref 6–23)
CO2: 27 meq/L (ref 19–32)
Calcium: 9.7 mg/dL (ref 8.4–10.5)
Chloride: 103 meq/L (ref 96–112)
Creatinine, Ser: 1.21 mg/dL (ref 0.40–1.50)
GFR: 59.95 mL/min — ABNORMAL LOW
Glucose, Bld: 94 mg/dL (ref 70–99)
Potassium: 4.5 meq/L (ref 3.5–5.1)
Sodium: 138 meq/L (ref 135–145)
Total Bilirubin: 0.7 mg/dL (ref 0.2–1.2)
Total Protein: 7.3 g/dL (ref 6.0–8.3)

## 2018-12-05 ENCOUNTER — Encounter: Payer: Self-pay | Admitting: Family Medicine

## 2018-12-05 ENCOUNTER — Ambulatory Visit (INDEPENDENT_AMBULATORY_CARE_PROVIDER_SITE_OTHER): Payer: PPO | Admitting: Family Medicine

## 2018-12-05 ENCOUNTER — Other Ambulatory Visit: Payer: Self-pay

## 2018-12-05 VITALS — BP 150/84 | HR 54 | Temp 98.1°F | Ht 69.0 in | Wt 223.5 lb

## 2018-12-05 DIAGNOSIS — Z0000 Encounter for general adult medical examination without abnormal findings: Secondary | ICD-10-CM | POA: Diagnosis not present

## 2018-12-05 DIAGNOSIS — I10 Essential (primary) hypertension: Secondary | ICD-10-CM | POA: Diagnosis not present

## 2018-12-05 DIAGNOSIS — Z7189 Other specified counseling: Secondary | ICD-10-CM

## 2018-12-05 NOTE — Progress Notes (Signed)
I have personally reviewed the Medicare Annual Wellness questionnaire and have noted 1. The patient's medical and social history 2. Their use of alcohol, tobacco or illicit drugs 3. Their current medications and supplements 4. The patient's functional ability including ADL's, fall risks, home safety risks and hearing or visual             impairment. 5. Diet and physical activities 6. Evidence for depression or mood disorders  The patients weight, height, BMI have been recorded in the chart and visual acuity is per eye clinic.  I have made referrals, counseling and provided education to the patient based review of the above and I have provided the pt with a written personalized care plan for preventive services.  Provider list updated- see scanned forms.  Routine anticipatory guidance given to patient.  See health maintenance. The possibility exists that previously documented standard health maintenance information may have been brought forward from a previous encounter into this note.  If needed, that same information has been updated to reflect the current situation based on today's encounter.    Flu 2020 Shingles discussed with patient PNA 2019 Tetanus 2017 Colon cancer screening done with Cologuard 2018 Prostate cancer screening deferred.  Discussed with patient about pros and cons of screening. Living will d/w pt. Wife would be designated if patient were incapacitated.  Cognitive function addressed- see scanned forms- and if abnormal then additional documentation follows.   He recovered from covid, d/w pt.  No overt residual sx now- he is back to working in his yard but still a little lower exercise capacity compared to prev baseline.  When he was sick he couldn't get from his house to his mailbox w/o fatigue and that is clearly better.   Hypertension:    Using medication without problems or lightheadedness: yes Chest pain with exertion:no Edema:no Short of breath:no Labs d/w  pt.   Rare gout sx.  No recent colchicine use.   PMH and SH reviewed  Meds, vitals, and allergies reviewed.   ROS: Per HPI.  Unless specifically indicated otherwise in HPI, the patient denies:  General: fever. Eyes: acute vision changes ENT: sore throat Cardiovascular: chest pain Respiratory: SOB GI: vomiting GU: dysuria Musculoskeletal: acute back pain Derm: acute rash Neuro: acute motor dysfunction Psych: worsening mood Endocrine: polydipsia Heme: bleeding Allergy: hayfever  GEN: nad, alert and oriented HEENT: ncat NECK: supple w/o LA, no carotid bruit. CV: rrr. PULM: ctab, no inc wob ABD: soft, +bs EXT: no edema SKIN: no acute rash  Health Maintenance  Topic Date Due  . PNA vac Low Risk Adult (2 of 2 - PCV13) 10/25/2018  . TETANUS/TDAP  07/30/2025  . INFLUENZA VACCINE  Completed  . Hepatitis C Screening  Completed

## 2018-12-05 NOTE — Patient Instructions (Signed)
Don't change your meds for now.  If your BP stays persistently>140/>90 then let me know.  Take care.  Glad to see you.

## 2018-12-06 NOTE — Assessment & Plan Note (Signed)
Neither the patient nor I were enthused about medicine changes given his recent Covid infection and recovery.  Defer statin and blood pressure medication change at this point.  He will work on diet and exercise and update me if his blood pressure remains persistently elevated.  He agrees with plan.

## 2018-12-06 NOTE — Assessment & Plan Note (Signed)
Flu 2020 Shingles discussed with patient PNA 2019 Tetanus 2017 Colon cancer screening done with Cologuard 2018 Prostate cancer screening deferred.  Discussed with patient about pros and cons of screening. Living will d/w pt. Wife would be designated if patient were incapacitated.  Cognitive function addressed- see scanned forms- and if abnormal then additional documentation follows.

## 2018-12-08 ENCOUNTER — Other Ambulatory Visit: Payer: PPO

## 2018-12-28 DIAGNOSIS — L821 Other seborrheic keratosis: Secondary | ICD-10-CM | POA: Diagnosis not present

## 2018-12-28 DIAGNOSIS — D1801 Hemangioma of skin and subcutaneous tissue: Secondary | ICD-10-CM | POA: Diagnosis not present

## 2018-12-28 DIAGNOSIS — L820 Inflamed seborrheic keratosis: Secondary | ICD-10-CM | POA: Diagnosis not present

## 2018-12-28 DIAGNOSIS — L570 Actinic keratosis: Secondary | ICD-10-CM | POA: Diagnosis not present

## 2018-12-28 DIAGNOSIS — L578 Other skin changes due to chronic exposure to nonionizing radiation: Secondary | ICD-10-CM | POA: Diagnosis not present

## 2018-12-28 DIAGNOSIS — D225 Melanocytic nevi of trunk: Secondary | ICD-10-CM | POA: Diagnosis not present

## 2019-02-12 DIAGNOSIS — B0089 Other herpesviral infection: Secondary | ICD-10-CM | POA: Diagnosis not present

## 2019-02-12 DIAGNOSIS — L570 Actinic keratosis: Secondary | ICD-10-CM | POA: Diagnosis not present

## 2019-02-22 ENCOUNTER — Other Ambulatory Visit: Payer: Self-pay | Admitting: Family Medicine

## 2019-03-05 ENCOUNTER — Ambulatory Visit: Payer: PPO | Attending: Internal Medicine

## 2019-03-05 DIAGNOSIS — Z23 Encounter for immunization: Secondary | ICD-10-CM | POA: Insufficient documentation

## 2019-03-05 NOTE — Progress Notes (Signed)
   Covid-19 Vaccination Clinic  Name:  Bryce Villanueva    MRN: [id number] DOB: 1950-06-15  03/05/2019  Mr. Gauld was observed post Covid-19 immunization for 15 minutes without incidence. He was provided with Vaccine Information Sheet and instruction to access the V-Safe system.   Mr. Foerst was instructed to call 911 with any severe reactions post vaccine: Marland Kitchen Difficulty breathing  . Swelling of your face and throat  . A fast heartbeat  . A bad rash all over your body  . Dizziness and weakness    Immunizations Administered    Name Date Dose VIS Date Route   Pfizer COVID-19 Vaccine 03/05/2019  3:50 PM 0.3 mL 01/05/2019 Intramuscular   Manufacturer: [place]   Lot: [id number]   [place]: [id number]

## 2019-03-24 ENCOUNTER — Other Ambulatory Visit: Payer: Self-pay | Admitting: Family Medicine

## 2019-03-30 ENCOUNTER — Ambulatory Visit: Payer: PPO | Attending: Internal Medicine

## 2019-03-30 DIAGNOSIS — Z23 Encounter for immunization: Secondary | ICD-10-CM | POA: Insufficient documentation

## 2019-03-30 NOTE — Progress Notes (Signed)
   Covid-19 Vaccination Clinic  Name:  Bryce Villanueva    MRN: [id number] DOB: [birth year]  03/30/2019  Bryce Villanueva was observed post Covid-19 immunization for 15 minutes without incident. He was provided with Vaccine Information Sheet and instruction to access the V-Safe system.   Bryce Villanueva was instructed to call 911 with any severe reactions post vaccine: Marland Kitchen Difficulty breathing  . Swelling of face and throat  . A fast heartbeat  . A bad rash all over body  . Dizziness and weakness   Immunizations Administered    Name Date Dose VIS Date Route   Pfizer COVID-19 Vaccine 03/30/2019 12:10 PM 0.3 mL 01/05/2019 Intramuscular   Manufacturer: [place]   Lot: [id number]   [place]: [id number]

## 2019-05-21 ENCOUNTER — Other Ambulatory Visit: Payer: Self-pay | Admitting: Family Medicine

## 2019-06-21 DIAGNOSIS — H1045 Other chronic allergic conjunctivitis: Secondary | ICD-10-CM | POA: Diagnosis not present

## 2019-06-21 DIAGNOSIS — H0102B Squamous blepharitis left eye, upper and lower eyelids: Secondary | ICD-10-CM | POA: Diagnosis not present

## 2019-06-21 DIAGNOSIS — H0102A Squamous blepharitis right eye, upper and lower eyelids: Secondary | ICD-10-CM | POA: Diagnosis not present

## 2019-06-21 DIAGNOSIS — H35033 Hypertensive retinopathy, bilateral: Secondary | ICD-10-CM | POA: Diagnosis not present

## 2019-06-21 DIAGNOSIS — H04123 Dry eye syndrome of bilateral lacrimal glands: Secondary | ICD-10-CM | POA: Diagnosis not present

## 2019-06-21 DIAGNOSIS — H2513 Age-related nuclear cataract, bilateral: Secondary | ICD-10-CM | POA: Diagnosis not present

## 2019-08-21 ENCOUNTER — Other Ambulatory Visit: Payer: Self-pay

## 2019-11-18 ENCOUNTER — Other Ambulatory Visit: Payer: Self-pay | Admitting: Family Medicine

## 2019-11-18 DIAGNOSIS — I10 Essential (primary) hypertension: Secondary | ICD-10-CM

## 2019-11-30 ENCOUNTER — Other Ambulatory Visit: Payer: Self-pay

## 2019-11-30 ENCOUNTER — Other Ambulatory Visit (INDEPENDENT_AMBULATORY_CARE_PROVIDER_SITE_OTHER): Payer: PPO

## 2019-11-30 DIAGNOSIS — I10 Essential (primary) hypertension: Secondary | ICD-10-CM

## 2019-11-30 LAB — COMPREHENSIVE METABOLIC PANEL
ALT: 15 U/L (ref 0–53)
AST: 22 U/L (ref 0–37)
Albumin: 4.5 g/dL (ref 3.5–5.2)
Alkaline Phosphatase: 56 U/L (ref 39–117)
BUN: 20 mg/dL (ref 6–23)
CO2: 29 mEq/L (ref 19–32)
Calcium: 9.5 mg/dL (ref 8.4–10.5)
Chloride: 102 mEq/L (ref 96–112)
Creatinine, Ser: 1.3 mg/dL (ref 0.40–1.50)
GFR: 56.96 mL/min — ABNORMAL LOW (ref >60.00)
Glucose, Bld: 86 mg/dL (ref 70–99)
Potassium: 4 mEq/L (ref 3.5–5.1)
Sodium: 138 mEq/L (ref 135–145)
Total Bilirubin: 0.6 mg/dL (ref 0.2–1.2)
Total Protein: 7.2 g/dL (ref 6.0–8.3)

## 2019-11-30 LAB — LIPID PANEL
Cholesterol: 213 mg/dL — ABNORMAL HIGH (ref 0–200)
HDL: 46.8 mg/dL (ref >39.00)
LDL Cholesterol: 142 mg/dL — ABNORMAL HIGH (ref 0–99)
NonHDL: 166.12
Total CHOL/HDL Ratio: 5
Triglycerides: 120 mg/dL (ref 0.0–149.0)
VLDL: 24 mg/dL (ref 0.0–40.0)

## 2019-12-07 ENCOUNTER — Ambulatory Visit (INDEPENDENT_AMBULATORY_CARE_PROVIDER_SITE_OTHER): Payer: PPO | Admitting: Family Medicine

## 2019-12-07 ENCOUNTER — Other Ambulatory Visit: Payer: Self-pay

## 2019-12-07 ENCOUNTER — Encounter: Payer: Self-pay | Admitting: Family Medicine

## 2019-12-07 VITALS — BP 120/80 | HR 60 | Temp 98.1°F | Ht 69.0 in | Wt 225.2 lb

## 2019-12-07 DIAGNOSIS — R21 Rash and other nonspecific skin eruption: Secondary | ICD-10-CM | POA: Diagnosis not present

## 2019-12-07 DIAGNOSIS — R399 Unspecified symptoms and signs involving the genitourinary system: Secondary | ICD-10-CM | POA: Diagnosis not present

## 2019-12-07 DIAGNOSIS — I10 Essential (primary) hypertension: Secondary | ICD-10-CM

## 2019-12-07 DIAGNOSIS — Z0000 Encounter for general adult medical examination without abnormal findings: Secondary | ICD-10-CM

## 2019-12-07 DIAGNOSIS — Z7189 Other specified counseling: Secondary | ICD-10-CM

## 2019-12-07 MED ORDER — LOSARTAN POTASSIUM 25 MG PO TABS: 25.0000 mg | ORAL_TABLET | Freq: Every day | ORAL | 3 refills | Status: DC

## 2019-12-07 MED ORDER — DESONIDE 0.05 % EX CREA: TOPICAL_CREAM | CUTANEOUS | 2 refills | Status: AC

## 2019-12-07 MED ORDER — TAMSULOSIN HCL 0.4 MG PO CAPS: 0.4000 mg | ORAL_CAPSULE | Freq: Every day | ORAL | 3 refills | Status: DC

## 2019-12-07 NOTE — Progress Notes (Signed)
This visit occurred during the SARS-CoV-2 public health emergency.  Safety protocols were in place, including screening questions prior to the visit, additional usage of staff PPE, and extensive cleaning of exam room while observing appropriate contact time as indicated for disinfecting solutions.  I have personally reviewed the Medicare Annual Wellness questionnaire and have noted 1. The patient's medical and social history 2. Their use of alcohol, tobacco or illicit drugs 3. Their current medications and supplements 4. The patient's functional ability including ADL's, fall risks, home safety risks and hearing or visual             impairment. 5. Diet and physical activities 6. Evidence for depression or mood disorders  The patients weight, height, BMI have been recorded in the chart and visual acuity is per eye clinic.  I have made referrals, counseling and provided education to the patient based review of the above and I have provided the pt with a written personalized care plan for preventive services.  Provider list updated- see scanned forms.  Routine anticipatory guidance given to patient.  See health maintenance. The possibility exists that previously documented standard health maintenance information may have been brought forward from a previous encounter into this note.  If needed, that same information has been updated to reflect the current situation based on today's encounter.    Flu 2021 Shingles discussed with patient PNA up-to-date Tetanus 2017  Covid vaccine 2021 Colon cancer screening discussed with patient.  He will check on Cologuard coverage and then update me.  I will await update from the patient. Prostate cancer screening declined by patient.  Discussed. Advance directive-wife designated patient were incapacitated. Cognitive function addressed- see scanned forms- and if abnormal then additional documentation follows.   H/o "small bladder my whole life"  He has  nocturia x3 at baseline, long term.  He is in the routine of getting up to urinate.  He was asking about options.  No burning with urination.  He doesn't have incontinence.    Hypertension:    Using medication without problems or lightheadedness: yes Chest pain with exertion:no Edema:no Short of breath:no  He doesn't have known CAD and we talked about stopping ASA given recent guidelines.    He has used desonide but his rx may be out of date.  New rx sent.  He has an itchy spot on the L medial shin.    PMH and SH reviewed  Meds, vitals, and allergies reviewed.   ROS: Per HPI.  Unless specifically indicated otherwise in HPI, the patient denies:  General: fever. Eyes: acute vision changes ENT: sore throat Cardiovascular: chest pain Respiratory: SOB GI: vomiting GU: dysuria Musculoskeletal: acute back pain Derm: acute rash Neuro: acute motor dysfunction Psych: worsening mood Endocrine: polydipsia Heme: bleeding Allergy: hayfever  GEN: nad, alert and oriented HEENT: ncat NECK: supple w/o LA CV: rrr. PULM: ctab, no inc wob ABD: soft, +bs EXT: no edema SKIN: no acute rash but he does have a mildly irritated spot on the left medial shin.

## 2019-12-07 NOTE — Patient Instructions (Addendum)
Check on coverage for shingrix and cologuard.   Try flomax and update.  Take care.  Glad to see you.

## 2019-12-09 DIAGNOSIS — R399 Unspecified symptoms and signs involving the genitourinary system: Secondary | ICD-10-CM | POA: Insufficient documentation

## 2019-12-09 NOTE — Assessment & Plan Note (Signed)
  Flu 2021 Shingles discussed with patient PNA up-to-date Tetanus 2017  Covid vaccine 2021 Colon cancer screening discussed with patient.  He will check on Cologuard coverage and then update me.  I will await update from the patient. Prostate cancer screening declined by patient.  Discussed. Advance directive-wife designated patient were incapacitated. Cognitive function addressed- see scanned forms- and if abnormal then additional documentation follows.

## 2019-12-09 NOTE — Assessment & Plan Note (Signed)
He can use desonide and then update me as needed.  He agrees.

## 2019-12-09 NOTE — Assessment & Plan Note (Signed)
He can try Flomax then update me as needed.  See after visit summary.  Routine cautions given to patient.  He agrees.

## 2019-12-09 NOTE — Assessment & Plan Note (Signed)
Advance directive-wife designated patient were incapacitated. 

## 2019-12-09 NOTE — Assessment & Plan Note (Signed)
Continue losartan.  Update me as needed.  He agrees.  Labs discussed with patient.

## 2019-12-19 ENCOUNTER — Other Ambulatory Visit: Payer: Self-pay | Admitting: Family Medicine

## 2019-12-19 ENCOUNTER — Encounter: Payer: Self-pay | Admitting: Family Medicine

## 2019-12-19 MED ORDER — CYCLOBENZAPRINE HCL 10 MG PO TABS: 5.0000 mg | ORAL_TABLET | Freq: Two times a day (BID) | ORAL | 0 refills | Status: DC | PRN

## 2019-12-19 NOTE — Telephone Encounter (Signed)
Please see message and advise.  Thank you. ° °

## 2019-12-31 DIAGNOSIS — L570 Actinic keratosis: Secondary | ICD-10-CM | POA: Diagnosis not present

## 2019-12-31 DIAGNOSIS — L821 Other seborrheic keratosis: Secondary | ICD-10-CM | POA: Diagnosis not present

## 2019-12-31 DIAGNOSIS — L814 Other melanin hyperpigmentation: Secondary | ICD-10-CM | POA: Diagnosis not present

## 2019-12-31 DIAGNOSIS — B0089 Other herpesviral infection: Secondary | ICD-10-CM | POA: Diagnosis not present

## 2019-12-31 DIAGNOSIS — D225 Melanocytic nevi of trunk: Secondary | ICD-10-CM | POA: Diagnosis not present

## 2019-12-31 DIAGNOSIS — D1801 Hemangioma of skin and subcutaneous tissue: Secondary | ICD-10-CM | POA: Diagnosis not present

## 2019-12-31 DIAGNOSIS — L820 Inflamed seborrheic keratosis: Secondary | ICD-10-CM | POA: Diagnosis not present

## 2020-05-06 DIAGNOSIS — H5203 Hypermetropia, bilateral: Secondary | ICD-10-CM | POA: Diagnosis not present

## 2020-05-06 DIAGNOSIS — H0102B Squamous blepharitis left eye, upper and lower eyelids: Secondary | ICD-10-CM | POA: Diagnosis not present

## 2020-05-06 DIAGNOSIS — H11153 Pinguecula, bilateral: Secondary | ICD-10-CM | POA: Diagnosis not present

## 2020-05-06 DIAGNOSIS — H52223 Regular astigmatism, bilateral: Secondary | ICD-10-CM | POA: Diagnosis not present

## 2020-05-06 DIAGNOSIS — H2513 Age-related nuclear cataract, bilateral: Secondary | ICD-10-CM | POA: Diagnosis not present

## 2020-05-06 DIAGNOSIS — H0102A Squamous blepharitis right eye, upper and lower eyelids: Secondary | ICD-10-CM | POA: Diagnosis not present

## 2020-05-06 DIAGNOSIS — H35033 Hypertensive retinopathy, bilateral: Secondary | ICD-10-CM | POA: Diagnosis not present

## 2020-05-06 DIAGNOSIS — H1045 Other chronic allergic conjunctivitis: Secondary | ICD-10-CM | POA: Diagnosis not present

## 2020-05-06 DIAGNOSIS — H04123 Dry eye syndrome of bilateral lacrimal glands: Secondary | ICD-10-CM | POA: Diagnosis not present

## 2020-05-06 DIAGNOSIS — H524 Presbyopia: Secondary | ICD-10-CM | POA: Diagnosis not present

## 2020-05-26 ENCOUNTER — Other Ambulatory Visit: Payer: Self-pay | Admitting: Family Medicine

## 2020-07-01 ENCOUNTER — Telehealth: Payer: Self-pay | Admitting: Family Medicine

## 2020-07-01 NOTE — Telephone Encounter (Signed)
LVM for pt to rtn my call to schedule AWV with [facility].  

## 2020-10-17 ENCOUNTER — Other Ambulatory Visit: Payer: Self-pay | Admitting: Family Medicine

## 2020-11-23 ENCOUNTER — Other Ambulatory Visit: Payer: Self-pay | Admitting: Family Medicine

## 2020-12-12 ENCOUNTER — Encounter: Payer: Self-pay | Admitting: Family Medicine

## 2020-12-12 ENCOUNTER — Ambulatory Visit (INDEPENDENT_AMBULATORY_CARE_PROVIDER_SITE_OTHER): Payer: PPO | Admitting: Family Medicine

## 2020-12-12 ENCOUNTER — Other Ambulatory Visit: Payer: Self-pay

## 2020-12-12 VITALS — BP 158/90 | HR 62 | Temp 97.6°F | Ht 69.0 in | Wt 220.0 lb

## 2020-12-12 DIAGNOSIS — R399 Unspecified symptoms and signs involving the genitourinary system: Secondary | ICD-10-CM

## 2020-12-12 DIAGNOSIS — R413 Other amnesia: Secondary | ICD-10-CM

## 2020-12-12 DIAGNOSIS — Z1211 Encounter for screening for malignant neoplasm of colon: Secondary | ICD-10-CM | POA: Diagnosis not present

## 2020-12-12 DIAGNOSIS — Z0000 Encounter for general adult medical examination without abnormal findings: Secondary | ICD-10-CM

## 2020-12-12 DIAGNOSIS — Z7189 Other specified counseling: Secondary | ICD-10-CM

## 2020-12-12 DIAGNOSIS — I10 Essential (primary) hypertension: Secondary | ICD-10-CM | POA: Diagnosis not present

## 2020-12-12 LAB — LIPID PANEL
Cholesterol: 247 mg/dL — ABNORMAL HIGH (ref 0–200)
HDL: 46.5 mg/dL (ref >39.00)
LDL Cholesterol: 172 mg/dL — ABNORMAL HIGH (ref 0–99)
NonHDL: 200.53
Total CHOL/HDL Ratio: 5
Triglycerides: 141 mg/dL (ref 0.0–149.0)
VLDL: 28.2 mg/dL (ref 0.0–40.0)

## 2020-12-12 LAB — CBC WITH DIFFERENTIAL/PLATELET
Basophils Absolute: 0.1 10*3/uL (ref 0.0–0.1)
Basophils Relative: 0.5 % (ref 0.0–3.0)
Eosinophils Absolute: 0.2 10*3/uL (ref 0.0–0.7)
Eosinophils Relative: 1.7 % (ref 0.0–5.0)
HCT: 46.7 % (ref 39.0–52.0)
Hemoglobin: 15.4 g/dL (ref 13.0–17.0)
Lymphocytes Relative: 23.8 % (ref 12.0–46.0)
Lymphs Abs: 2.4 10*3/uL (ref 0.7–4.0)
MCHC: 33.1 g/dL (ref 30.0–36.0)
MCV: 86.9 fl (ref 78.0–100.0)
Monocytes Absolute: 0.7 10*3/uL (ref 0.1–1.0)
Monocytes Relative: 7.3 % (ref 3.0–12.0)
Neutro Abs: 6.7 10*3/uL (ref 1.4–7.7)
Neutrophils Relative %: 66.7 % (ref 43.0–77.0)
Platelets: 258 10*3/uL (ref 150.0–400.0)
RBC: 5.37 Mil/uL (ref 4.22–5.81)
RDW: 13.5 % (ref 11.5–15.5)
WBC: 10 10*3/uL (ref 4.0–10.5)

## 2020-12-12 LAB — COMPREHENSIVE METABOLIC PANEL
ALT: 14 U/L (ref 0–53)
AST: 22 U/L (ref 0–37)
Albumin: 5 g/dL (ref 3.5–5.2)
Alkaline Phosphatase: 66 U/L (ref 39–117)
BUN: 19 mg/dL (ref 6–23)
CO2: 27 mEq/L (ref 19–32)
Calcium: 10 mg/dL (ref 8.4–10.5)
Chloride: 101 mEq/L (ref 96–112)
Creatinine, Ser: 1.33 mg/dL (ref 0.40–1.50)
GFR: 55.02 mL/min — ABNORMAL LOW (ref >60.00)
Glucose, Bld: 88 mg/dL (ref 70–99)
Potassium: 4.5 mEq/L (ref 3.5–5.1)
Sodium: 138 mEq/L (ref 135–145)
Total Bilirubin: 0.7 mg/dL (ref 0.2–1.2)
Total Protein: 7.5 g/dL (ref 6.0–8.3)

## 2020-12-12 LAB — VITAMIN B12: Vitamin B-12: 441 pg/mL (ref 211–911)

## 2020-12-12 LAB — TSH: TSH: 3.84 u[IU]/mL (ref 0.35–5.50)

## 2020-12-12 MED ORDER — TAMSULOSIN HCL 0.4 MG PO CAPS
0.4000 mg | ORAL_CAPSULE | Freq: Every day | ORAL | 3 refills | Status: DC
Start: 2020-12-12 — End: 2021-12-15

## 2020-12-12 MED ORDER — CYCLOBENZAPRINE HCL 10 MG PO TABS: 5.0000 mg | ORAL_TABLET | Freq: Two times a day (BID) | ORAL | 12 refills | Status: DC | PRN

## 2020-12-12 MED ORDER — LOSARTAN POTASSIUM 50 MG PO TABS: 50.0000 mg | ORAL_TABLET | Freq: Every day | ORAL | 3 refills | Status: DC

## 2020-12-12 NOTE — Progress Notes (Signed)
This visit occurred during the SARS-CoV-2 public health emergency.  Safety protocols were in place, including screening questions prior to the visit, additional usage of staff PPE, and extensive cleaning of exam room while observing appropriate contact time as indicated for disinfecting solutions.  I have personally reviewed the Medicare Annual Wellness questionnaire and have noted 1. The patient's medical and social history 2. Their use of alcohol, tobacco or illicit drugs 3. Their current medications and supplements 4. The patient's functional ability including ADL's, fall risks, home safety risks and hearing or visual             impairment. 5. Diet and physical activities 6. Evidence for depression or mood disorders  The patients weight, height, BMI have been recorded in the chart and visual acuity is per eye clinic.  I have made referrals, counseling and provided education to the patient based review of the above and I have provided the pt with a written personalized care plan for preventive services.  Provider list updated- see scanned forms.  Routine anticipatory guidance given to patient.  See health maintenance. The possibility exists that previously documented standard health maintenance information may have been brought forward from a previous encounter into this note.  If needed, that same information has been updated to reflect the current situation based on today's encounter.    Flu up-to-date Shingles previously done PNA up-to-date Tetanus 2017 COVID-vaccine previously done Cologuard pending 2022 Prostate cancer screening and PSA options (with potential risks and benefits of testing vs not testing) were discussed along with recent recs/guidelines.  He declined testing PSA at this point. Advance directive-wife designated if patient were incapacitated Cognitive function addressed- see scanned forms- and if abnormal then additional documentation follows.   In addition to  [facility] Wellness, follow up visit for the below conditions:  Hypertension:    Using medication without problems or lightheadedness: yes Chest pain with exertion:no Edema:no Short of breath:no He is still going to the gym and still splitting wood at home.    No recent gout flares.    He had covid and noted a dec in memory in the interval w/o red flag sx.  Some short term changes.  See following testing.  We talked about trying to compensate and work on brain stimulating exercises.  He will update me as needed.  Flexeril helped but didn't cause ADE.    He is taking flomax in the AM.  He notes less effect by late that night.  D/w pt about taking it at night.  He has longstanding "lifelong" history of nocturia.    PMH and SH reviewed  Meds, vitals, and allergies reviewed.   ROS: Per HPI.  Unless specifically indicated otherwise in HPI, the patient denies:  General: fever. Eyes: acute vision changes ENT: sore throat Cardiovascular: chest pain Respiratory: SOB GI: vomiting GU: dysuria Musculoskeletal: acute back pain Derm: acute rash Neuro: acute motor dysfunction Psych: worsening mood Endocrine: polydipsia Heme: bleeding Allergy: hayfever  GEN: nad, alert and oriented HEENT: ncat NECK: supple w/o LA CV: rrr. PULM: ctab, no inc wob ABD: soft, +bs EXT: no edema SKIN: no acute rash

## 2020-12-12 NOTE — Patient Instructions (Addendum)
Try flomax at night.  Update me as needed.  Try 50mg  losartan.  Let me know if your BP stays above 140/90 after 2 weeks.   Take care.  Glad to see you.

## 2020-12-14 NOTE — Assessment & Plan Note (Signed)
Reasonable to increase losartan to 50 mg a day and then update me if his blood pressure not controlled.  See notes on labs.  Continue work on diet and exercise.

## 2020-12-14 NOTE — Assessment & Plan Note (Signed)
Reasonable to try taking Flomax at night and he will update me if that does not help.

## 2020-12-14 NOTE — Assessment & Plan Note (Signed)
Flu up-to-date Shingles previously done PNA up-to-date Tetanus 2017 COVID-vaccine previously done Cologuard pending 2022 Prostate cancer screening and PSA options (with potential risks and benefits of testing vs not testing) were discussed along with recent recs/guidelines.  He declined testing PSA at this point. Advance directive-wife designated if patient were incapacitated Cognitive function addressed- see scanned forms- and if abnormal then additional documentation follows.

## 2020-12-14 NOTE — Assessment & Plan Note (Signed)
Advance directive- wife designated if patient were incapacitated.  

## 2021-01-14 DIAGNOSIS — Z1211 Encounter for screening for malignant neoplasm of colon: Secondary | ICD-10-CM | POA: Diagnosis not present

## 2021-01-28 LAB — COLOGUARD: COLOGUARD: NEGATIVE

## 2021-02-24 ENCOUNTER — Other Ambulatory Visit: Payer: Self-pay | Admitting: Family Medicine

## 2021-03-12 NOTE — Progress Notes (Signed)
Subjective:   Bryce Villanueva is a 71 y.o. male who presents for Medicare Annual/Subsequent preventive examination.    Review of Systems     Cardiac Risk Factors include: advanced age (>70men, >41 women);hypertension;dyslipidemia     Objective:    Today's Vitals   03/13/21 0936  BP: 126/80  Pulse: 71  SpO2: 97%  Weight: 229 lb 9.6 oz (104.1 kg)  Height: 5\' 9"  (1.753 m)   Body mass index is 33.91 kg/m.  Advanced Directives 03/13/2021 11/02/2011  Does Patient Have a Medical Advance Directive? Yes Patient does not have advance directive;Patient would not like information  Type of Scientist, forensic Power of [place];Living will -  Does patient want to make changes to medical advance directive? Yes (MAU/Ambulatory/Procedural Areas - Information given) -  Pre-existing out of facility DNR order (yellow form or pink MOST form) - No    Current Medications (verified) Outpatient Encounter Medications as of 03/13/2021  Medication Sig   colchicine 0.6 MG tablet TAKE 1 TABLET (0.6 MG TOTAL) BY MOUTH DAILY AS NEEDED.   cyclobenzaprine (FLEXERIL) 10 MG tablet Take 0.5-1 tablets (5-10 mg total) by mouth 2 (two) times daily as needed for muscle spasms (Sedation caution.).   desonide (DESOWEN) 0.05 % cream APPLY ON THE SKIN TWICE DAILY AS NEEDED   famciclovir (FAMVIR) 250 MG tablet TAKE 2 TABLETS BY MOUTH DAILY FOR 1 DAY, THEN 1 TABLET BY MOUTH DAILY AS NEEDED FOR FLARES   fluticasone (FLONASE) 50 MCG/ACT nasal spray SPRAY 2 SPRAYS INTO EACH NOSTRIL EVERY DAY   losartan (COZAAR) 50 MG tablet Take 1 tablet (50 mg total) by mouth daily.   tamsulosin (FLOMAX) 0.4 MG CAPS capsule Take 1 capsule (0.4 mg total) by mouth daily.   losartan (COZAAR) 25 MG tablet TAKE 1 TABLET BY MOUTH EVERY DAY   No facility-administered encounter medications on file as of 03/13/2021.    Allergies (verified) Amlodipine, Lisinopril, and Pravastatin   History: Past Medical History:  Diagnosis Date    Biceps tendon tear RIGHT ARM   COVID-19    2020   Diverticulosis of colon    Hypertension    Kidney stones    Labral tear of shoulder    Past Surgical History:  Procedure Laterality Date   FOOT SURGERY  03/05/09   RIGHT ANKLE  BONE SPUR REMOVAL (Dr. Beola Cord)   [facility] Right 2013   Family History  Problem Relation Age of Onset   Emphysema Father        smoker   Alcohol abuse Father    Hypertension Brother    Alcohol abuse Brother    Hypertension Son    Hypertension Brother    Pulmonary embolism Brother    Liver cancer Sister    Prostate cancer Neg Hx    Colon cancer Neg Hx    Social History   Socioeconomic History   Marital status: Married    Spouse name: Not on file   Number of children: 2   Years of education: Not on file   Highest education level: Not on file  Occupational History   Occupation: Manufacturing engineer    Employer: [employer]  Tobacco Use   Smoking status: Never   Smokeless tobacco: Never  Substance and Sexual Activity   Alcohol use: Yes    Alcohol/week: 1.0 standard drink    Types: 1 Standard drinks or equivalent per week    Comment: one drink on the weekends   Drug use: No   Sexual  activity: Not on file  Other Topics Concern   Not on file  Social History Narrative   2 biological children and 2 stepchildren   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, did Primary school teacher   Social Determinants of Radio broadcast assistant Strain: Low Risk    Difficulty of Paying Living Expenses: Not hard at all  Food Insecurity: No Food Insecurity   Worried About Charity fundraiser in the Last Year: Never true   Arboriculturist in the Last Year: Never true  Transportation Needs: No Transportation Needs   Lack of Transportation (Medical): No   Lack of Transportation (Non-Medical): No  Physical Activity: Sufficiently Active   Days of Exercise per Week: 5 days   Minutes of Exercise per Session: 150+ min  Stress: No Stress Concern  Present   Feeling of Stress : Not at all  Social Connections: Moderately Integrated   Frequency of Communication with Friends and Family: More than three times a week   Frequency of Social Gatherings with Friends and Family: More than three times a week   Attends Religious Services: More than 4 times per year   Active Member of [employer] or Organizations: No   Attends Music therapist: Never   Marital Status: Married    Tobacco Counseling Counseling given: Not Answered   Clinical Intake:  Pre-visit preparation completed: Yes  Pain : No/denies pain     BMI - recorded: 33.91 Nutritional Status: BMI > 30  Obese Nutritional Risks: None Diabetes: No  How often do you need to have someone help you when you read instructions, pamphlets, or other written materials from your doctor or pharmacy?: 1 - Never  Diabetic? No  Interpreter Needed?: No  Information entered by :: Bryce Sabine MkcCain LPN   Activities of Daily Living In your present state of health, do you have any difficulty performing the following activities: 03/13/2021  Hearing? Y  Vision? N  Difficulty concentrating or making decisions? N  Walking or climbing stairs? N  Dressing or bathing? N  Doing errands, shopping? N  Preparing Food and eating ? N  Using the Toilet? N  In the past six months, have you accidently leaked urine? N  Do you have problems with loss of bowel control? N  Managing your Medications? N  Managing your Finances? N  Housekeeping or managing your Housekeeping? N  Some recent data might be hidden    Patient Care Team: Tonia Ghent, MD as PCP - General (Family Medicine)  Indicate any recent Medical Services you may have received from other than [facility] providers in the past year (date may be approximate).     Assessment:   This is a routine wellness examination for [name].  Hearing/Vision screen Vision Screening - Comments:: Last exam 6 months ago, Dr. Katy Fitch , wears glasses    Dietary issues and exercise activities discussed: Type of exercise: walking, Time (Minutes): 60 (pt does a lot of walking at work), Frequency (Times/Week): 5, Weekly Exercise (Minutes/Week): 300, Intensity: Moderate   Goals Addressed             This Visit's Progress    Patient Stated       Would like to start exercising at the gym        Depression Screen PHQ 2/9 Scores 03/13/2021 12/12/2020 12/07/2019 10/24/2017 09/16/2016  PHQ - 2 Score 0 0 0 0 0    Fall Risk Fall Risk  03/13/2021 12/12/2020 12/07/2019 08/21/2019 10/24/2017  Falls in the past year? 0 0 0 0 No  Comment - - - Emmi Telephone Survey: data to providers prior to load -  Number falls in past yr: 0 0 0 - -  Injury with Fall? 0 0 0 - -  Risk for fall due to : No Fall Risks No Fall Risks - - -  Follow up Falls prevention discussed Falls evaluation completed Falls evaluation completed - -    FALL RISK PREVENTION PERTAINING TO THE HOME:  Any stairs in or around the home? Yes  If so, are there any without handrails? No  Home free of loose throw rugs in walkways, pet beds, electrical cords, etc? Yes  Adequate lighting in your home to reduce risk of falls? Yes   ASSISTIVE DEVICES UTILIZED TO PREVENT FALLS:  Life alert? No  Use of a cane, walker or w/c? No  Grab bars in the bathroom? Yes  Shower chair or bench in shower? No  Elevated toilet seat or a handicapped toilet? Yes   TIMED UP AND GO:  Was the test performed? Yes Length of time to ambulate 10 feet: 10 sec.   Gait steady and fast without use of assistive device  Cognitive Function:      Normal cognitive status assessed by direct observation by this Nurse Health Advisor. No abnormalities found.    Immunizations Immunization History  Administered Date(s) Administered   Influenza Whole 12/25/2008   Influenza, High Dose Seasonal PF 10/17/2017, 10/25/2018, 10/05/2019, 10/10/2020   Influenza,inj,Quad PF,6+ Mos 10/16/2016   Influenza-Unspecified  11/28/2015, 10/16/2016   PFIZER Comirnaty(Gray Top)Covid-19 Tri-Sucrose Vaccine 06/04/2020   PFIZER(Purple Top)SARS-COV-2 Vaccination 03/05/2019, 03/30/2019, 10/22/2019   Pfizer Covid-19 Vaccine Bivalent Booster 29yrs & up 02/06/2021   Pneumococcal Polysaccharide-23 10/24/2017, 11/12/2019   Td 06/25/2005   Tdap 07/31/2015   Zoster Recombinat (Shingrix) 05/30/2020, 09/11/2020    TDAP status: Up to date  Flu Vaccine status: Up to date  Pneumococcal vaccine status: Up to date  Covid-19 vaccine status: Completed vaccines  Qualifies for Shingles Vaccine? Yes   Zostavax completed No   Shingrix Completed?: Yes  Screening Tests Health Maintenance  Topic Date Due   Pneumonia Vaccine 31+ Years old (2 - PCV) 11/11/2020   Fecal DNA (Cologuard)  01/15/2024   TETANUS/TDAP  07/30/2025   INFLUENZA VACCINE  Completed   COVID-19 Vaccine  Completed   Hepatitis C Screening  Completed   Zoster Vaccines- Shingrix  Completed   HPV VACCINES  Aged Out    Health Maintenance  Health Maintenance Due  Topic Date Due   Pneumonia Vaccine 95+ Years old (2 - PCV) 11/11/2020    Colorectal cancer screening: Type of screening: Cologuard. Completed 01/14/21. Repeat every 3 years  Lung Cancer Screening: (Low Dose CT Chest recommended if Age 20-80 years, 30 pack-year currently smoking OR have quit w/in 15years.) does not qualify.     Additional Screening:  Hepatitis C Screening: does qualify; Completed 01/26/00  Vision Screening: Recommended annual ophthalmology exams for early detection of glaucoma and other disorders of the eye. Is the patient up to date with their annual eye exam?  Yes  Who is the provider or what is the name of the office in which the patient attends annual eye exams? Dr. Katy Fitch    Dental Screening: Recommended annual dental exams for proper oral hygiene  Community Resource Referral / Chronic Care Management: CRR required this visit?  No   CCM required this visit?  No       Plan:  I have personally reviewed and noted the following in the patients chart:   Medical and social history Use of alcohol, tobacco or illicit drugs  Current medications and supplements including opioid prescriptions. Patient is not currently taking opioid prescriptions. Functional ability and status Nutritional status Physical activity Advanced directives List of other physicians Hospitalizations, surgeries, and ER visits in previous 12 months Vitals Screenings to include cognitive, depression, and falls Referrals and appointments  In addition, I have reviewed and discussed with patient certain preventive protocols, quality metrics, and best practice recommendations. A written personalized care plan for preventive services as well as general preventive health recommendations were provided to patient.      Loma Messing, LPN   [id number]   Nurse Health Advisor  Nurse Notes: none

## 2021-03-13 ENCOUNTER — Ambulatory Visit (INDEPENDENT_AMBULATORY_CARE_PROVIDER_SITE_OTHER): Payer: PPO

## 2021-03-13 ENCOUNTER — Other Ambulatory Visit: Payer: Self-pay

## 2021-03-13 VITALS — BP 126/80 | HR 71 | Ht 69.0 in | Wt 229.6 lb

## 2021-03-13 DIAGNOSIS — Z0000 Encounter for general adult medical examination without abnormal findings: Secondary | ICD-10-CM | POA: Diagnosis not present

## 2021-03-13 NOTE — Patient Instructions (Signed)
Bryce Villanueva , Thank you for taking time to come for your Medicare Wellness Visit. I appreciate your ongoing commitment to your health goals. Please review the following plan we discussed and let me know if I can assist you in the future.   Screening recommendations/referrals: Colonoscopy: up to date, completed 01/14/21, due 01/15/24 Recommended yearly ophthalmology/optometry visit for glaucoma screening and checkup Recommended yearly dental visit for hygiene and checkup  Vaccinations: Influenza vaccine: up to date Pneumococcal vaccine: up to date  Tdap vaccine: up to date, completed 07/31/15, due-07/30/25 Shingles vaccine: up to date   Covid-19: up to date   Advanced directives: Please bring a copy of Living Will and/or [place] for your chart.   Conditions/risks identified: see problem list   Next appointment: Follow up in one year for your annual wellness visit. 03/15/22 @ 12:00pm, this will be a telephone visit per your request  Preventive Care 65 Years and Older, Male Preventive care refers to lifestyle choices and visits with your health care provider that can promote health and wellness. What does preventive care include? A yearly physical exam. This is also called an annual well check. Dental exams once or twice a year. Routine eye exams. Ask your health care provider how often you should have your eyes checked. Personal lifestyle choices, including: Daily care of your teeth and gums. Regular physical activity. Eating a healthy diet. Avoiding tobacco and drug use. Limiting alcohol use. Practicing safe sex. Taking low doses of aspirin every day. Taking vitamin and mineral supplements as recommended by your health care provider. What happens during an annual well check? The services and screenings done by your health care provider during your annual well check will depend on your age, overall health, lifestyle risk factors, and family history of  disease. Counseling  Your health care provider may ask you questions about your: Alcohol use. Tobacco use. Drug use. Emotional well-being. Home and relationship well-being. Sexual activity. Eating habits. History of falls. Memory and ability to understand (cognition). Work and work Statistician. Screening  You may have the following tests or measurements: Height, weight, and BMI. Blood pressure. Lipid and cholesterol levels. These may be checked every 5 years, or more frequently if you are over 9 years old. Skin check. Lung cancer screening. You may have this screening every year starting at age 23 if you have a 30-pack-year history of smoking and currently smoke or have quit within the past 15 years. Fecal occult blood test (FOBT) of the stool. You may have this test every year starting at age 35. Flexible sigmoidoscopy or colonoscopy. You may have a sigmoidoscopy every 5 years or a colonoscopy every 10 years starting at age 57. Prostate cancer screening. Recommendations will vary depending on your family history and other risks. Hepatitis C blood test. Hepatitis B blood test. Sexually transmitted disease (STD) testing. Diabetes screening. This is done by checking your blood sugar (glucose) after you have not eaten for a while (fasting). You may have this done every 1-3 years. Abdominal aortic aneurysm (AAA) screening. You may need this if you are a current or former smoker. Osteoporosis. You may be screened starting at age 4 if you are at high risk. Talk with your health care provider about your test results, treatment options, and if necessary, the need for more tests. Vaccines  Your health care provider may recommend certain vaccines, such as: Influenza vaccine. This is recommended every year. Tetanus, diphtheria, and acellular pertussis (Tdap, Td) vaccine. You may need  a Td booster every 10 years. Zoster vaccine. You may need this after age 69. Pneumococcal 13-valent  conjugate (PCV13) vaccine. One dose is recommended after age 13. Pneumococcal polysaccharide (PPSV23) vaccine. One dose is recommended after age 58. Talk to your health care provider about which screenings and vaccines you need and how often you need them. This information is not intended to replace advice given to you by your health care provider. Make sure you discuss any questions you have with your health care provider. Document Released: 02/07/2015 Document Revised: 10/01/2015 Document Reviewed: 11/12/2014 Elsevier Interactive Patient Education  2017 [place] Prevention in the Home Falls can cause injuries. They can happen to people of all ages. There are many things you can do to make your home safe and to help prevent falls. What can I do on the outside of my home? Regularly fix the edges of walkways and driveways and fix any cracks. Remove anything that might make you trip as you walk through a door, such as a raised step or threshold. Trim any bushes or trees on the path to your home. Use bright outdoor lighting. Clear any walking paths of anything that might make someone trip, such as rocks or tools. Regularly check to see if handrails are loose or broken. Make sure that both sides of any steps have handrails. Any raised decks and porches should have guardrails on the edges. Have any leaves, snow, or ice cleared regularly. Use sand or salt on walking paths during winter. Clean up any spills in your garage right away. This includes oil or grease spills. What can I do in the bathroom? Use night lights. Install grab bars by the toilet and in the tub and shower. Do not use towel bars as grab bars. Use non-skid mats or decals in the tub or shower. If you need to sit down in the shower, use a plastic, non-slip stool. Keep the floor dry. Clean up any water that spills on the floor as soon as it happens. Remove soap buildup in the tub or shower regularly. Attach bath mats  securely with double-sided non-slip rug tape. Do not have throw rugs and other things on the floor that can make you trip. What can I do in the bedroom? Use night lights. Make sure that you have a light by your bed that is easy to reach. Do not use any sheets or blankets that are too big for your bed. They should not hang down onto the floor. Have a firm chair that has side arms. You can use this for support while you get dressed. Do not have throw rugs and other things on the floor that can make you trip. What can I do in the kitchen? Clean up any spills right away. Avoid walking on wet floors. Keep items that you use a lot in easy-to-reach places. If you need to reach something above you, use a strong step stool that has a grab bar. Keep electrical cords out of the way. Do not use floor polish or wax that makes floors slippery. If you must use wax, use non-skid floor wax. Do not have throw rugs and other things on the floor that can make you trip. What can I do with my stairs? Do not leave any items on the stairs. Make sure that there are handrails on both sides of the stairs and use them. Fix handrails that are broken or loose. Make sure that handrails are as long as the stairways. Check  any carpeting to make sure that it is firmly attached to the stairs. Fix any carpet that is loose or worn. Avoid having throw rugs at the top or bottom of the stairs. If you do have throw rugs, attach them to the floor with carpet tape. Make sure that you have a light switch at the top of the stairs and the bottom of the stairs. If you do not have them, ask someone to add them for you. What else can I do to help prevent falls? Wear shoes that: Do not have high heels. Have rubber bottoms. Are comfortable and fit you well. Are closed at the toe. Do not wear sandals. If you use a stepladder: Make sure that it is fully opened. Do not climb a closed stepladder. Make sure that both sides of the stepladder  are locked into place. Ask someone to hold it for you, if possible. Clearly mark and make sure that you can see: Any grab bars or handrails. First and last steps. Where the edge of each step is. Use tools that help you move around (mobility aids) if they are needed. These include: Canes. Walkers. Scooters. Crutches. Turn on the lights when you go into a dark area. Replace any light bulbs as soon as they burn out. Set up your furniture so you have a clear path. Avoid moving your furniture around. If any of your floors are uneven, fix them. If there are any pets around you, be aware of where they are. Review your medicines with your doctor. Some medicines can make you feel dizzy. This can increase your chance of falling. Ask your doctor what other things that you can do to help prevent falls. This information is not intended to replace advice given to you by your health care provider. Make sure you discuss any questions you have with your health care provider. Document Released: 11/07/2008 Document Revised: 06/19/2015 Document Reviewed: 02/15/2014 Elsevier Interactive Patient Education  2017 [employer].

## 2021-03-23 DIAGNOSIS — B001 Herpesviral vesicular dermatitis: Secondary | ICD-10-CM | POA: Diagnosis not present

## 2021-03-23 DIAGNOSIS — L218 Other seborrheic dermatitis: Secondary | ICD-10-CM | POA: Diagnosis not present

## 2021-03-23 DIAGNOSIS — L570 Actinic keratosis: Secondary | ICD-10-CM | POA: Diagnosis not present

## 2021-03-23 DIAGNOSIS — D2261 Melanocytic nevi of right upper limb, including shoulder: Secondary | ICD-10-CM | POA: Diagnosis not present

## 2021-03-23 DIAGNOSIS — L821 Other seborrheic keratosis: Secondary | ICD-10-CM | POA: Diagnosis not present

## 2021-03-23 DIAGNOSIS — D485 Neoplasm of uncertain behavior of skin: Secondary | ICD-10-CM | POA: Diagnosis not present

## 2021-03-23 DIAGNOSIS — L814 Other melanin hyperpigmentation: Secondary | ICD-10-CM | POA: Diagnosis not present

## 2021-03-23 DIAGNOSIS — D225 Melanocytic nevi of trunk: Secondary | ICD-10-CM | POA: Diagnosis not present

## 2021-05-06 DIAGNOSIS — H0102A Squamous blepharitis right eye, upper and lower eyelids: Secondary | ICD-10-CM | POA: Diagnosis not present

## 2021-05-06 DIAGNOSIS — H35033 Hypertensive retinopathy, bilateral: Secondary | ICD-10-CM | POA: Diagnosis not present

## 2021-05-06 DIAGNOSIS — H40053 Ocular hypertension, bilateral: Secondary | ICD-10-CM | POA: Diagnosis not present

## 2021-05-06 DIAGNOSIS — H5203 Hypermetropia, bilateral: Secondary | ICD-10-CM | POA: Diagnosis not present

## 2021-05-06 DIAGNOSIS — H0102B Squamous blepharitis left eye, upper and lower eyelids: Secondary | ICD-10-CM | POA: Diagnosis not present

## 2021-05-06 DIAGNOSIS — H52223 Regular astigmatism, bilateral: Secondary | ICD-10-CM | POA: Diagnosis not present

## 2021-05-06 DIAGNOSIS — H11153 Pinguecula, bilateral: Secondary | ICD-10-CM | POA: Diagnosis not present

## 2021-05-06 DIAGNOSIS — H1045 Other chronic allergic conjunctivitis: Secondary | ICD-10-CM | POA: Diagnosis not present

## 2021-05-06 DIAGNOSIS — H524 Presbyopia: Secondary | ICD-10-CM | POA: Diagnosis not present

## 2021-05-06 DIAGNOSIS — H04123 Dry eye syndrome of bilateral lacrimal glands: Secondary | ICD-10-CM | POA: Diagnosis not present

## 2021-05-06 DIAGNOSIS — H2513 Age-related nuclear cataract, bilateral: Secondary | ICD-10-CM | POA: Diagnosis not present

## 2021-05-22 ENCOUNTER — Other Ambulatory Visit: Payer: Self-pay | Admitting: Family Medicine

## 2021-07-14 ENCOUNTER — Telehealth (INDEPENDENT_AMBULATORY_CARE_PROVIDER_SITE_OTHER): Payer: PPO | Admitting: Family Medicine

## 2021-07-14 ENCOUNTER — Encounter: Payer: Self-pay | Admitting: Family Medicine

## 2021-07-14 ENCOUNTER — Other Ambulatory Visit: Payer: Self-pay | Admitting: Family Medicine

## 2021-07-14 VITALS — Wt 220.0 lb

## 2021-07-14 DIAGNOSIS — R059 Cough, unspecified: Secondary | ICD-10-CM | POA: Diagnosis not present

## 2021-07-14 DIAGNOSIS — R0981 Nasal congestion: Secondary | ICD-10-CM

## 2021-07-14 MED ORDER — BENZONATATE 100 MG PO CAPS: ORAL_CAPSULE | ORAL | 0 refills | Status: DC

## 2021-07-14 MED ORDER — ALBUTEROL SULFATE HFA 108 (90 BASE) MCG/ACT IN AERS: 2.0000 | INHALATION_SPRAY | Freq: Four times a day (QID) | RESPIRATORY_TRACT | 0 refills | Status: DC | PRN

## 2021-07-14 NOTE — Patient Instructions (Signed)
-  I sent the medication(s) we discussed to your pharmacy: Meds ordered this encounter  Medications   benzonatate (TESSALON PERLES) 100 MG capsule    Sig: 1-2 capsules up to twice daily as needed for cough.    Dispense:  30 capsule    Refill:  0   albuterol (PROAIR HFA) 108 (90 Base) MCG/ACT inhaler    Sig: Inhale 2 puffs into the lungs every 6 (six) hours as needed for wheezing or shortness of breath.    Dispense:  1 each    Refill:  0     I hope you are feeling better soon!  Seek in person care promptly if your symptoms worsen, new concerns arise or you are not improving with treatment.  It was nice to meet you today. I help [facility] out with telemedicine visits on Tuesdays and Thursdays and am happy to help if you need a virtual follow up visit on those days. Otherwise, if you have any concerns or questions following this visit please schedule a follow up visit with your Primary Care office or seek care at a local urgent care clinic to avoid delays in care. If you are having severe or life threatening symptoms please call 911 and/or go to the nearest emergency room.

## 2021-07-14 NOTE — Progress Notes (Signed)
Virtual Visit via Video Note  I connected with Bryce Villanueva  on 07/14/21 at  4:00 PM EDT by a video enabled telemedicine application and verified that I am speaking with the correct person using two identifiers.  Location patient: [place] Location provider:work or home office Persons participating in the virtual visit: patient, provider  I discussed the limitations and requested verbal permission for telemedicine visit. The patient expressed understanding and agreed to proceed.   HPI:  Acute telemedicine visit for cough and congestion: -Onset: 4 days ago -Symptoms include: nasal congestion, sweating when was working yesterday in and outside - thinks had a fever but has not checked, feels tired and is not usually like that, when he gets up and walks around he feels really tired, coughing, sinuses are congested and has some green mucus,  has not required his inhaler but wants refill in case needs it as reports every since having covid a few years ago he always ends up needing the inhaler (proair) when sick and is almost out of it.  -Denies: documented fever, CP, SOB, NVD,  sinus pain, headache -covid test was negative -Has tried: OTC sinus medicine -son and grandson not been feeling well either with similar symptoms -Pertinent past medical history: see below -Pertinent medication allergies: Allergies  Allergen Reactions   Amlodipine     Frequent urination   Lisinopril     Dry cough   Pravastatin     GI upset, intolerant   -COVID-19 vaccine status:  Immunization History  Administered Date(s) Administered   Influenza Whole 12/25/2008   Influenza, High Dose Seasonal PF 10/17/2017, 10/25/2018, 10/05/2019, 10/10/2020   Influenza,inj,Quad PF,6+ Mos 10/16/2016   Influenza-Unspecified 11/28/2015, 10/16/2016   PFIZER Comirnaty(Gray Top)Covid-19 Tri-Sucrose Vaccine 06/04/2020   PFIZER(Purple Top)SARS-COV-2 Vaccination 03/05/2019, 03/30/2019, 10/22/2019   Pfizer Covid-19 Vaccine Bivalent Booster 77yr  & up 02/06/2021   Pneumococcal Polysaccharide-23 10/24/2017, 11/12/2019   Td 06/25/2005   Tdap 07/31/2015   Zoster Recombinat (Shingrix) 05/30/2020, 09/11/2020     ROS: See pertinent positives and negatives per HPI.  Past Medical History:  Diagnosis Date   Biceps tendon tear RIGHT ARM   COVID-19    2020   Diverticulosis of colon    Hypertension    Kidney stones    Labral tear of shoulder     Past Surgical History:  Procedure Laterality Date   FOOT SURGERY  03/05/09   RIGHT ANKLE  BONE SPUR REMOVAL (Dr. BBeola Cord   SHOULDER SURGERY Right 2013     Current Outpatient Medications:    albuterol (PROAIR HFA) 108 (90 Base) MCG/ACT inhaler, Inhale 2 puffs into the lungs every 6 (six) hours as needed for wheezing or shortness of breath., Disp: 1 each, Rfl: 0   benzonatate (TESSALON PERLES) 100 MG capsule, 1-2 capsules up to twice daily as needed for cough., Disp: 30 capsule, Rfl: 0   colchicine 0.6 MG tablet, TAKE 1 TABLET (0.6 MG TOTAL) BY MOUTH DAILY AS NEEDED., Disp: 30 tablet, Rfl: 0   cyclobenzaprine (FLEXERIL) 10 MG tablet, Take 0.5-1 tablets (5-10 mg total) by mouth 2 (two) times daily as needed for muscle spasms (Sedation caution.)., Disp: 30 tablet, Rfl: 12   desonide (DESOWEN) 0.05 % cream, APPLY ON THE SKIN TWICE DAILY AS NEEDED, Disp: 60 g, Rfl: 2   famciclovir (FAMVIR) 250 MG tablet, TAKE 2 TABLETS BY MOUTH DAILY FOR 1 DAY, THEN 1 TABLET BY MOUTH DAILY AS NEEDED FOR FLARES, Disp: 30 tablet, Rfl: 1   fluticasone (FLONASE) 50 MCG/ACT nasal spray,  SPRAY 2 SPRAYS INTO EACH NOSTRIL EVERY DAY, Disp: 48 mL, Rfl: 3   losartan (COZAAR) 25 MG tablet, TAKE 1 TABLET BY MOUTH EVERY DAY, Disp: 90 tablet, Rfl: 0   losartan (COZAAR) 50 MG tablet, Take 1 tablet (50 mg total) by mouth daily., Disp: 90 tablet, Rfl: 3   tamsulosin (FLOMAX) 0.4 MG CAPS capsule, Take 1 capsule (0.4 mg total) by mouth daily., Disp: 90 capsule, Rfl: 3  EXAM:  VITALS per patient if applicable:  GENERAL: alert,  oriented, appears well and in no acute distress  HEENT: atraumatic, conjunttiva clear, no obvious abnormalities on inspection of external nose and ears  NECK: normal movements of the head and neck  LUNGS: on inspection no signs of respiratory distress, breathing rate appears normal, no obvious gross SOB, gasping or wheezing  CV: no obvious cyanosis  MS: moves all visible extremities without noticeable abnormality  PSYCH/NEURO: pleasant and cooperative, no obvious depression or anxiety, speech and thought processing grossly intact  ASSESSMENT AND PLAN:  Discussed the following assessment and plan:  Nasal sinus congestion  Cough, unspecified type  -we discussed possible serious and likely etiologies, options for evaluation and workup, limitations of telemedicine visit vs in person visit, treatment, treatment risks and precautions. Pt is agreeable to treatment via telemedicine at this moment. Query likely viral resp illness vs other. He opted to try nasal saline sinus rinses, Tessalon rx for cough, sent refill of his inhaler in case needed and advised to seek prompt virtual visit or in person care if worsening, new symptoms arise, or if is not improving with treatment as expected per our conversation of expected course. Discussed options for follow up care. Did let this patient know that I do telemedicine on Tuesdays and Thursdays for [place] and those are the days I am logged into the system. Advised to schedule follow up visit with PCP, [place] virtual visits or [facility] if any further questions or concerns to avoid delays in care.   I discussed the assessment and treatment plan with the patient. The patient was provided an opportunity to ask questions and all were answered. The patient agreed with the plan and demonstrated an understanding of the instructions.     Lucretia Kern, DO

## 2021-08-26 ENCOUNTER — Other Ambulatory Visit: Payer: Self-pay | Admitting: Family Medicine

## 2021-09-08 ENCOUNTER — Other Ambulatory Visit: Payer: Self-pay | Admitting: Family Medicine

## 2021-10-29 ENCOUNTER — Other Ambulatory Visit: Payer: Self-pay | Admitting: Family Medicine

## 2021-10-29 NOTE — Telephone Encounter (Signed)
Patient needs AWV scheduled after 12/12/21. Please call to schedule appt.

## 2021-10-29 NOTE — Telephone Encounter (Signed)
Spoke with patient wife and he will call back when he gets home.

## 2021-11-02 NOTE — Telephone Encounter (Signed)
Patient called back and scheduled lab and cpe appt for nov

## 2021-11-11 DIAGNOSIS — H40053 Ocular hypertension, bilateral: Secondary | ICD-10-CM | POA: Diagnosis not present

## 2021-12-06 ENCOUNTER — Other Ambulatory Visit: Payer: Self-pay | Admitting: Family Medicine

## 2021-12-06 DIAGNOSIS — I10 Essential (primary) hypertension: Secondary | ICD-10-CM

## 2021-12-08 ENCOUNTER — Other Ambulatory Visit (INDEPENDENT_AMBULATORY_CARE_PROVIDER_SITE_OTHER): Payer: PPO

## 2021-12-08 DIAGNOSIS — I10 Essential (primary) hypertension: Secondary | ICD-10-CM

## 2021-12-08 LAB — LIPID PANEL
Cholesterol: 216 mg/dL — ABNORMAL HIGH (ref 0–200)
HDL: 46.3 mg/dL (ref >39.00)
LDL Cholesterol: 147 mg/dL — ABNORMAL HIGH (ref 0–99)
NonHDL: 169.84
Total CHOL/HDL Ratio: 5
Triglycerides: 115 mg/dL (ref 0.0–149.0)
VLDL: 23 mg/dL (ref 0.0–40.0)

## 2021-12-08 LAB — COMPREHENSIVE METABOLIC PANEL
ALT: 14 U/L (ref 0–53)
AST: 20 U/L (ref 0–37)
Albumin: 4.7 g/dL (ref 3.5–5.2)
Alkaline Phosphatase: 58 U/L (ref 39–117)
BUN: 24 mg/dL — ABNORMAL HIGH (ref 6–23)
CO2: 31 mEq/L (ref 19–32)
Calcium: 9.6 mg/dL (ref 8.4–10.5)
Chloride: 102 mEq/L (ref 96–112)
Creatinine, Ser: 1.33 mg/dL (ref 0.40–1.50)
GFR: 54.64 mL/min — ABNORMAL LOW (ref >60.00)
Glucose, Bld: 95 mg/dL (ref 70–99)
Potassium: 4.3 mEq/L (ref 3.5–5.1)
Sodium: 138 mEq/L (ref 135–145)
Total Bilirubin: 0.7 mg/dL (ref 0.2–1.2)
Total Protein: 7.1 g/dL (ref 6.0–8.3)

## 2021-12-15 ENCOUNTER — Ambulatory Visit (INDEPENDENT_AMBULATORY_CARE_PROVIDER_SITE_OTHER): Payer: PPO | Admitting: Family Medicine

## 2021-12-15 ENCOUNTER — Encounter: Payer: Self-pay | Admitting: Family Medicine

## 2021-12-15 VITALS — BP 116/72 | HR 64 | Temp 98.0°F | Ht 69.0 in | Wt 201.0 lb

## 2021-12-15 DIAGNOSIS — I10 Essential (primary) hypertension: Secondary | ICD-10-CM | POA: Diagnosis not present

## 2021-12-15 DIAGNOSIS — R399 Unspecified symptoms and signs involving the genitourinary system: Secondary | ICD-10-CM | POA: Diagnosis not present

## 2021-12-15 DIAGNOSIS — Z7189 Other specified counseling: Secondary | ICD-10-CM

## 2021-12-15 DIAGNOSIS — G2581 Restless legs syndrome: Secondary | ICD-10-CM

## 2021-12-15 DIAGNOSIS — Z0000 Encounter for general adult medical examination without abnormal findings: Secondary | ICD-10-CM

## 2021-12-15 MED ORDER — NYSTATIN 100000 UNIT/GM EX POWD: 1.0000 | Freq: Two times a day (BID) | CUTANEOUS | 1 refills | Status: DC

## 2021-12-15 MED ORDER — CYCLOBENZAPRINE HCL 10 MG PO TABS: ORAL_TABLET | ORAL | 3 refills | Status: DC

## 2021-12-15 MED ORDER — TAMSULOSIN HCL 0.4 MG PO CAPS: 0.4000 mg | ORAL_CAPSULE | Freq: Every day | ORAL | 3 refills | Status: DC

## 2021-12-15 MED ORDER — ALBUTEROL SULFATE HFA 108 (90 BASE) MCG/ACT IN AERS: INHALATION_SPRAY | RESPIRATORY_TRACT | 2 refills | Status: AC

## 2021-12-15 MED ORDER — LOSARTAN POTASSIUM 50 MG PO TABS: 50.0000 mg | ORAL_TABLET | Freq: Every day | ORAL | 3 refills | Status: DC

## 2021-12-15 NOTE — Progress Notes (Unsigned)
Hypertension:    Using medication without problems or lightheadedness: yes Chest pain with exertion:no Edema:no Short of breath:no Losartan '50mg'$ .   Labs d/w pt.   Intentional weight loss noted.   He is walking at work, up to 30-50 miles per week.   Prn use of SABA, only used when he has a cold.    He had RLS sx at night.  Legs get jumpy.  Prev TSH, B12, CBC wnl. Sx going on for 2-3 years.  No tremor.  Wife prev noted him kicking at night, while asleep.  Discussed trying flexeril earlier in the AM to see if that helps.    Discussed options for jock itch, if it happens.  Rx sent for nystatin.  No sx now.    LUTS.  Still on flomax.  Nocturia x1, prev ~5 per night.    He had memory changes after initial covid infection.  He has improved in the meantime.  Flu up-to-date Shingles previously done PNA d/w pt.   Tetanus 2017 COVID-vaccine previously done Cologuard 2022 Prostate cancer screening and PSA options (with potential risks and benefits of testing vs not testing) were discussed along with recent recs/guidelines.  He declined testing PSA at this point. Advance directive-wife designated if patient were incapacitated  Meds, vitals, and allergies reviewed.   PMH and SH reviewed  ROS: Per HPI unless specifically indicated in ROS section   GEN: nad, alert and oriented HEENT: ncat NECK: supple w/o LA CV: rrr. PULM: ctab, no inc wob ABD: soft, +bs EXT: no edema SKIN: no acute rash but Scrape on L forearm, from cutting wood.  Does not appear infected.  30 minutes were devoted to patient care in this encounter (this includes time spent reviewing the patient's file/history, interviewing and examining the patient, counseling/reviewing plan with patient).

## 2021-12-15 NOTE — Patient Instructions (Addendum)
If you are getting lightheaded, then cut losartan in half.  Take care.  Glad to see you. Try flexeril a little earlier and see if that helps with your legs.  Let me know if that doesn't help.   Thanks for your effort.

## 2021-12-16 DIAGNOSIS — G2581 Restless legs syndrome: Secondary | ICD-10-CM | POA: Insufficient documentation

## 2021-12-16 DIAGNOSIS — Z0000 Encounter for general adult medical examination without abnormal findings: Secondary | ICD-10-CM | POA: Insufficient documentation

## 2021-12-16 NOTE — Assessment & Plan Note (Signed)
Advance directive- wife designated if patient were incapacitated.  

## 2021-12-16 NOTE — Assessment & Plan Note (Signed)
With previous unremarkable labs noted.  Discussed. He had RLS sx at night.  Legs get jumpy.  Prev TSH, B12, CBC wnl. Sx going on for 2-3 years.  No tremor.  Wife prev noted him kicking at night, while asleep.  Discussed trying flexeril earlier in the AM to see if that helps.  He will update me as needed.

## 2021-12-16 NOTE — Assessment & Plan Note (Signed)
Intentional weight loss noted.  Continue losartan for now.  He can cut it in half if he has any lightheadedness.  Labs discussed with patient.

## 2021-12-16 NOTE — Assessment & Plan Note (Signed)
Still on flomax.  Nocturia x1, prev ~5 per night.  Significant improvement.  Continue Flomax.

## 2021-12-16 NOTE — Assessment & Plan Note (Signed)
Flu up-to-date Shingles previously done PNA d/w pt.   Tetanus 2017 COVID-vaccine previously done Cologuard 2022 Prostate cancer screening and PSA options (with potential risks and benefits of testing vs not testing) were discussed along with recent recs/guidelines.  He declined testing PSA at this point. Advance directive-wife designated if patient were incapacitated

## 2022-02-14 ENCOUNTER — Other Ambulatory Visit: Payer: Self-pay | Admitting: Family Medicine

## 2022-02-15 NOTE — Telephone Encounter (Signed)
Last office visit 12/15/21 for [place].  Last refilled 12/15/21 for 60 g with 1 refill.  No future appointments with PCP.

## 2022-02-16 ENCOUNTER — Encounter: Payer: Self-pay | Admitting: Internal Medicine

## 2022-02-16 ENCOUNTER — Ambulatory Visit (INDEPENDENT_AMBULATORY_CARE_PROVIDER_SITE_OTHER): Payer: PPO | Admitting: Internal Medicine

## 2022-02-16 VITALS — BP 130/84 | HR 60 | Temp 97.4°F | Ht 69.0 in | Wt 197.0 lb

## 2022-02-16 DIAGNOSIS — S46811A Strain of other muscles, fascia and tendons at shoulder and upper arm level, right arm, initial encounter: Secondary | ICD-10-CM

## 2022-02-16 MED ORDER — HYDROCODONE-ACETAMINOPHEN 5-325 MG PO TABS: 1.0000 | ORAL_TABLET | Freq: Four times a day (QID) | ORAL | 0 refills | Status: DC | PRN

## 2022-02-16 MED ORDER — MELOXICAM 15 MG PO TABS: 15.0000 mg | ORAL_TABLET | Freq: Every day | ORAL | 0 refills | Status: DC | PRN

## 2022-02-16 NOTE — Patient Instructions (Signed)
Try an over the counter lidocaine patch (4%) in the evening on the painful spot.

## 2022-02-16 NOTE — Progress Notes (Signed)
Subjective:    Patient ID: Bryce Villanueva, male    DOB: 1950-03-22, 72 y.o.   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 No clear help Pain also shoots down back  No pain into arms No arm weakness  Takes cyclobenzaprine every night anyway  Current Outpatient Medications on File Prior to Visit  Medication Sig Dispense Refill   albuterol (VENTOLIN HFA) 108 (90 Base) MCG/ACT inhaler TAKE 2 PUFFS BY MOUTH EVERY 6 HOURS AS NEEDED FOR COUGH 8.5 each 2   cyclobenzaprine (FLEXERIL) 10 MG tablet TAKE 1/2 TO 1 TABLET BY MOUTH TWICE A DAY AS NEEDED FOR MUSCLE SPASMS (CAUTION: SEDATION) 90 tablet 3   desonide (DESOWEN) 0.05 % cream APPLY ON THE SKIN TWICE DAILY AS NEEDED 60 g 2   famciclovir (FAMVIR) 250 MG tablet TAKE 2 TABLETS BY MOUTH DAILY FOR 1 DAY, THEN 1 TABLET BY MOUTH DAILY AS NEEDED FOR FLARES 30 tablet 1   fluticasone (FLONASE) 50 MCG/ACT nasal spray SPRAY 2 SPRAYS INTO EACH NOSTRIL EVERY DAY 48 mL 3   losartan (COZAAR) 50 MG tablet Take 1 tablet (50 mg total) by mouth daily. 90 tablet 3   nystatin (MYCOSTATIN/NYSTOP) powder Apply 1 Application topically 2 (two) times daily. 60 g 1   tamsulosin (FLOMAX) 0.4 MG CAPS capsule Take 1 capsule (0.4 mg total) by mouth daily. 90 capsule 3   No current facility-administered medications on file prior to visit.    Allergies  Allergen Reactions   Amlodipine     Frequent urination   Lisinopril     Dry cough   Pravastatin     GI upset, intolerant    Past Medical History:   Diagnosis Date   Biceps tendon tear RIGHT ARM   COVID-19    2020   Diverticulosis of colon    Hypertension    Kidney stones    Labral tear of shoulder     Past Surgical History:  Procedure Laterality Date   FOOT SURGERY  03/05/09   RIGHT ANKLE  BONE SPUR REMOVAL (Dr. Beola Cord)   [place] Right 2013    Family History  Problem Relation Age of Onset   Emphysema Father        smoker   Alcohol abuse Father    Esophageal cancer Sister    Hypertension Brother    Alcohol abuse Brother    Hypertension Brother    Pulmonary embolism Brother    Hypertension Son    Prostate cancer Neg Hx    Colon cancer Neg Hx     Social History   Socioeconomic History   Marital status: Married    Spouse name: Not on file   Number of children: 2   Years of education: Not on file   Highest education level: Not on file  Occupational History   Occupation: Manufacturing engineer    Employer: [employer]  Tobacco Use   Smoking status: Never    Passive exposure: Past   Smokeless  tobacco: Never  Substance and Sexual Activity   Alcohol use: Yes    Alcohol/week: 1.0 standard drink of alcohol    Types: 1 Standard drinks or equivalent per week    Comment: one drink on the weekends   Drug use: No   Sexual activity: Not on file  Other Topics Concern   Not on file  Social History Narrative   2 biological children and 2 stepchildren   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, did Primary school teacher   Social Determinants of Health   Financial Resource Strain: [place]  (03/13/2021)   Overall Financial Resource Strain (CARDIA)    Difficulty of Paying Living Expenses: Not hard at all  Food Insecurity: No Food Insecurity (03/13/2021)   Hunger Vital Sign    Worried About Running Out of Food in the Last Year: Never true    [employer] in the Last Year: Never true  Transportation Needs: No Transportation Needs (03/13/2021)   PRAPARE - Hydrologist  (Medical): No    Lack of Transportation (Non-Medical): No  Physical Activity: Sufficiently Active (03/13/2021)   Exercise Vital Sign    Days of Exercise per Week: 5 days    Minutes of Exercise per Session: 150+ min  Stress: No Stress Concern Present (03/13/2021)   [place]    Feeling of Stress : Not at all  Social Connections: Moderately Integrated (03/13/2021)   Social Connection and Isolation Panel [NHANES]    Frequency of Communication with Friends and Family: More than three times a week    Frequency of Social Gatherings with Friends and Family: More than three times a week    Attends Religious Services: More than 4 times per year    Active Member of [employer] or Organizations: No    Attends Archivist Meetings: Never    Marital Status: Married  Human resources officer Violence: Not At Risk (03/13/2021)   Humiliation, Afraid, Rape, and Kick questionnaire    Fear of Current or Ex-Partner: No    Emotionally Abused: No    Physically Abused: No    Sexually Abused: No   Review of Systems Has had past neck problems      Objective:   Physical Exam Constitutional:      Appearance: Normal appearance.  Neck:     Comments: Some restriction in flexion and tilt both ways Tenderness along right trapezius Neurological:     Mental Status: He is alert.     Comments: No arm weakness            Assessment & Plan:

## 2022-02-16 NOTE — Assessment & Plan Note (Signed)
Hasn't lost function---not likely a tear Discussed heat at first--ice after work Will try lidocaine at night Meloxicam '15mg'$  daily prn Already on cyclobenzaprine Few norco for prn

## 2022-02-17 NOTE — Telephone Encounter (Signed)
Sent.  Let me know if this isn't helping.  Thanks.

## 2022-02-23 ENCOUNTER — Ambulatory Visit: Payer: PPO | Admitting: Family Medicine

## 2022-03-15 ENCOUNTER — Ambulatory Visit (INDEPENDENT_AMBULATORY_CARE_PROVIDER_SITE_OTHER): Payer: PPO

## 2022-03-15 ENCOUNTER — Other Ambulatory Visit: Payer: Self-pay | Admitting: Internal Medicine

## 2022-03-15 VITALS — Ht 69.0 in | Wt 197.0 lb

## 2022-03-15 DIAGNOSIS — Z0000 Encounter for general adult medical examination without abnormal findings: Secondary | ICD-10-CM | POA: Diagnosis not present

## 2022-03-15 NOTE — Progress Notes (Signed)
I connected with  Bryce Villanueva on 03/15/22 by a audio enabled telemedicine application and verified that I am speaking with the correct person using two identifiers.  Patient Location: Home  Provider Location: Office/Clinic  I discussed the limitations of evaluation and management by telemedicine. The patient expressed understanding and agreed to proceed.  Subjective:   Bryce Villanueva is a 72 y.o. male who presents for Medicare Annual/Subsequent preventive examination.  Review of Systems    Cardiac Risk Factors include: advanced age (>73mn, >>68women);dyslipidemia;hypertension;male gender    Objective:    Today's Vitals   03/15/22 1535  Weight: 197 lb (89.4 kg)  Height: 5' 9"$  (1.753 m)   Body mass index is 29.09 kg/m.     03/15/2022    3:42 PM 03/13/2021    9:43 AM 11/02/2011    6:10 AM  Advanced Directives  Does Patient Have a Medical Advance Directive? Yes Yes Patient does not have advance directive;Patient would not like information  Type of APension scheme managerPower of AHavanaLiving will   Does patient want to make changes to medical advance directive?  Yes (MAU/Ambulatory/Procedural Areas - Information given)   Pre-existing out of facility DNR order (yellow form or pink MOST form)   No    Current Medications (verified) Outpatient Encounter Medications as of 03/15/2022  Medication Sig   albuterol (VENTOLIN HFA) 108 (90 Base) MCG/ACT inhaler TAKE 2 PUFFS BY MOUTH EVERY 6 HOURS AS NEEDED FOR COUGH   cyclobenzaprine (FLEXERIL) 10 MG tablet TAKE 1/2 TO 1 TABLET BY MOUTH TWICE A DAY AS NEEDED FOR MUSCLE SPASMS (CAUTION: SEDATION)   desonide (DESOWEN) 0.05 % cream APPLY ON THE SKIN TWICE DAILY AS NEEDED   famciclovir (FAMVIR) 250 MG tablet TAKE 2 TABLETS BY MOUTH DAILY FOR 1 DAY, THEN 1 TABLET BY MOUTH DAILY AS NEEDED FOR FLARES   fluticasone (FLONASE) 50 MCG/ACT nasal spray SPRAY 2 SPRAYS INTO EACH NOSTRIL EVERY DAY   losartan (COZAAR) 50 MG tablet Take 1 tablet  (50 mg total) by mouth daily.   nystatin (MYCOSTATIN/NYSTOP) powder APPLY TO AFFECTED AREA TWICE A DAY   tamsulosin (FLOMAX) 0.4 MG CAPS capsule Take 1 capsule (0.4 mg total) by mouth daily.   HYDROcodone-acetaminophen (NORCO/VICODIN) 5-325 MG tablet Take 1 tablet by mouth every 6 (six) hours as needed for moderate pain.   meloxicam (MOBIC) 15 MG tablet Take 1 tablet (15 mg total) by mouth daily as needed for pain.   No facility-administered encounter medications on file as of 03/15/2022.    Allergies (verified) Amlodipine, Lisinopril, and Pravastatin   History: Past Medical History:  Diagnosis Date   Biceps tendon tear RIGHT ARM   COVID-19    2020   Diverticulosis of colon    Hypertension    Kidney stones    Labral tear of shoulder    Past Surgical History:  Procedure Laterality Date   FOOT SURGERY  03/05/09   RIGHT ANKLE  BONE SPUR REMOVAL (Dr. BBeola Cord   [place] 2013   Family History  Problem Relation Age of Onset   Emphysema Father        smoker   Alcohol abuse Father    Esophageal cancer Sister    Hypertension Brother    Alcohol abuse Brother    Hypertension Brother    Pulmonary embolism Brother    Hypertension Son    Prostate cancer Neg Hx    Colon cancer Neg Hx    Social History   Socioeconomic History  Marital status: Married    Spouse name: Not on file   Number of children: 2   Years of education: Not on file   Highest education level: Not on file  Occupational History   Occupation: Manufacturing engineer    Employer: [employer]  Tobacco Use   Smoking status: Never    Passive exposure: Past   Smokeless tobacco: Never  Substance and Sexual Activity   Alcohol use: Yes    Alcohol/week: 1.0 standard drink of alcohol    Types: 1 Standard drinks or equivalent per week    Comment: one drink on the weekends   Drug use: No   Sexual activity: Not on file  Other Topics Concern   Not on file  Social History Narrative   2 biological  children and 2 stepchildren   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, did Primary school teacher   Social Determinants of Health   Financial Resource Strain: [place]  (03/11/2022)   Overall Financial Resource Strain (CARDIA)    Difficulty of Paying Living Expenses: Not hard at all  Food Insecurity: No Food Insecurity (03/11/2022)   Hunger Vital Sign    Worried About Running Out of Food in the Last Year: Never true    [place] in the Last Year: Never true  Transportation Needs: No Transportation Needs (03/11/2022)   PRAPARE - Hydrologist (Medical): No    Lack of Transportation (Non-Medical): No  Physical Activity: Sufficiently Active (03/11/2022)   Exercise Vital Sign    Days of Exercise per Week: 6 days    Minutes of Exercise per Session: 150+ min  Stress: No Stress Concern Present (03/11/2022)   [place]    Feeling of Stress : Not at all  Social Connections: Unknown (03/11/2022)   Social Connection and Isolation Panel [NHANES]    Frequency of Communication with Friends and Family: Three times a week    Frequency of Social Gatherings with Friends and Family: Once a week    Attends Religious Services: Not on Advertising copywriter or Organizations: No    Attends Archivist Meetings: Never    Marital Status: Married    Tobacco Counseling Counseling given: Not Answered   Clinical Intake:              How often do you need to have someone help you when you read instructions, pamphlets, or other written materials from your doctor or pharmacy?: 1 - Never  [place] of Daily Living    03/11/2022   11:15 AM  In your present state of health, do you have any difficulty performing the following activities:  Hearing? 1  Vision? 0  Difficulty concentrating or making decisions? 0  Walking or climbing stairs? 0  Dressing or  bathing? 0  Doing errands, shopping? 0  Preparing Food and eating ? N  Using the Toilet? N  In the past six months, have you accidently leaked urine? N  Do you have problems with loss of bowel control? N  Managing your Medications? N  Managing your Finances? N  Housekeeping or managing your Housekeeping? N    Patient Care Team: Tonia Ghent, MD as PCP - General (Family Medicine)  Indicate any recent Medical Services you may have received from other than [facility] providers in the past year (date may be approximate).  Assessment:   This is a routine wellness examination for [name].  Hearing/Vision screen Hearing Screening - Comments:: Adequate hearing Vision Screening - Comments:: Adequate vision [facility]  Dietary issues and exercise activities discussed: Exercise limited by: None identified   Goals Addressed             This Visit's Progress    Patient Stated   On track    Would like to start exercising at the gym        Depression Screen    03/15/2022    3:41 PM 07/14/2021    3:46 PM 03/13/2021    9:52 AM 12/12/2020    9:57 AM 12/07/2019    8:35 AM 10/24/2017   11:02 AM 09/16/2016   10:43 AM  PHQ 2/9 Scores  PHQ - 2 Score 0 0 0 0 0 0 0    Fall Risk    03/11/2022   11:15 AM 07/14/2021    3:46 PM 03/13/2021    9:49 AM 12/12/2020    9:57 AM 12/07/2019    8:35 AM  [place] in the past year? 0 0 0 0 0  Number falls in past yr: 0 0 0 0 0  Injury with Fall? 0 0 0 0 0  Risk for fall due to :  No Fall Risks No Fall Risks No Fall Risks   Follow up  Falls evaluation completed Falls prevention discussed Falls evaluation completed Falls evaluation completed    [place]:  Any stairs in or around the home? No  If so, are there any without handrails? No  Home free of loose throw rugs in walkways, pet beds, electrical cords, etc? Yes  Adequate lighting in your home to reduce risk of falls? Yes    ASSISTIVE DEVICES UTILIZED TO PREVENT FALLS:  Life alert? No  Use of a cane, walker or w/c? No  Grab bars in the bathroom? Yes  Shower chair or bench in shower? No  Elevated toilet seat or a handicapped toilet? No    Cognitive Function:        03/15/2022    3:46 PM  6CIT Screen  What Year? 0 points  What month? 0 points  What time? 0 points  Count back from 20 0 points  Months in reverse 0 points  Repeat phrase 0 points  Total Score 0 points   Immunizations Immunization History  Administered Date(s) Administered   Fluad Quad(high Dose 65+) 10/02/2021   Influenza Whole 12/25/2008   Influenza, High Dose Seasonal PF 10/17/2017, 10/25/2018, 10/05/2019, 10/10/2020   Influenza,inj,Quad PF,6+ Mos 10/16/2016   Influenza-Unspecified 11/28/2015, 10/16/2016   PFIZER Comirnaty(Gray Top)Covid-19 Tri-Sucrose Vaccine 06/04/2020   PFIZER(Purple Top)SARS-COV-2 Vaccination 03/05/2019, 03/30/2019, 10/22/2019   Pfizer Covid-19 Vaccine Bivalent Booster 9yr & up 02/06/2021   Pneumococcal Polysaccharide-23 10/24/2017, 11/12/2019   Td 06/25/2005   Tdap 07/31/2015   Zoster Recombinat (Shingrix) 05/30/2020, 09/11/2020    TDAP status: Up to date  Flu Vaccine status: Up to date  Pneumococcal vaccine status: Up to date  Covid-19 vaccine status: Completed vaccines  Qualifies for Shingles Vaccine? Yes   Zostavax completed Yes   Shingrix Completed?: Yes  Screening Tests Health Maintenance  Topic Date Due   COVID-19 Vaccine (6 - 2023-24 season) 09/25/2021   Pneumonia Vaccine 70 Years old (2 of 2 - PCV) 07/15/2022 (Originally 11/11/2020)   Medicare Annual Wellness (AWV)  03/16/2023   Fecal DNA (Cologuard)  01/15/2024   DTaP/Tdap/Td (  3 - Td or Tdap) 07/30/2025   INFLUENZA VACCINE  Completed   Hepatitis C Screening  Completed   Zoster Vaccines- Shingrix  Completed   HPV VACCINES  Aged Out    Health Maintenance  Health Maintenance Due  Topic Date Due   COVID-19 Vaccine (6 -  2023-24 season) 09/25/2021    Colorectal cancer screening: Type of screening: Cologuard. Completed yes. Repeat every 3 years  Lung Cancer Screening: (Low Dose CT Chest recommended if Age 31-80 years, 30 pack-year currently smoking OR have quit w/in 15years.) does not qualify.   Lung Cancer Screening Referral: no  Additional Screening:  Hepatitis C Screening: does not qualify; Completed no  Vision Screening: Recommended annual ophthalmology exams for early detection of glaucoma and other disorders of the eye. Is the patient up to date with their annual eye exam?  Yes  Who is the provider or what is the name of the office in which the patient attends annual eye exams? [facility] in [place] If pt is not established with a provider, would they like to be referred to a provider to establish care? No .   Dental Screening: Recommended annual dental exams for proper oral hygiene  Community Resource Referral / Chronic Care Management: CRR required this visit?  No   CCM required this visit?  No      Plan:     I have personally reviewed and noted the following in the patient's chart:   Medical and social history Use of alcohol, tobacco or illicit drugs  Current medications and supplements including opioid prescriptions. Patient is not currently taking opioid prescriptions. Functional ability and status Nutritional status Physical activity Advanced directives List of other physicians Hospitalizations, surgeries, and ER visits in previous 12 months Vitals Screenings to include cognitive, depression, and falls Referrals and appointments  In addition, I have reviewed and discussed with patient certain preventive protocols, quality metrics, and best practice recommendations. A written personalized care plan for preventive services as well as general preventive health recommendations were provided to patient.     Roger Shelter, LPN   [id number]   Nurse Notes: pt says he is  doing and has no problems, concerns or questions.

## 2022-03-15 NOTE — Patient Instructions (Signed)
Bryce Villanueva , Thank you for taking time to come for your Medicare Wellness Visit. I appreciate your ongoing commitment to your health goals. Please review the following plan we discussed and let me know if I can assist you in the future.   These are the goals we discussed:  Goals      Patient Stated     Would like to start exercising at the gym         This is a list of the screening recommended for you and due dates:  Health Maintenance  Topic Date Due   COVID-19 Vaccine (6 - 2023-24 season) 09/25/2021   Pneumonia Vaccine (2 of 2 - PCV) 07/15/2022*   Medicare Annual Wellness Visit  03/16/2023   Cologuard (Stool DNA test)  01/15/2024   DTaP/Tdap/Td vaccine (3 - Td or Tdap) 07/30/2025   Flu Shot  Completed   Hepatitis C Screening: USPSTF Recommendation to screen - Ages 70-79 yo.  Completed   Zoster (Shingles) Vaccine  Completed   HPV Vaccine  Aged Out  *Topic was postponed. The date shown is not the original due date.    Advanced directives: yes  Conditions/risks identified: none  Next appointment: Follow up in one year for your annual wellness visit. 03/17/2023 @ 3pm telephone  Preventive Care 70 Years and Older, Male  Preventive care refers to lifestyle choices and visits with your health care provider that can promote health and wellness. What does preventive care include? A yearly physical exam. This is also called an annual well check. Dental exams once or twice a year. Routine eye exams. Ask your health care provider how often you should have your eyes checked. Personal lifestyle choices, including: Daily care of your teeth and gums. Regular physical activity. Eating a healthy diet. Avoiding tobacco and drug use. Limiting alcohol use. Practicing safe sex. Taking low doses of aspirin every day. Taking vitamin and mineral supplements as recommended by your health care provider. What happens during an annual well check? The services and screenings done by your  health care provider during your annual well check will depend on your age, overall health, lifestyle risk factors, and family history of disease. Counseling  Your health care provider may ask you questions about your: Alcohol use. Tobacco use. Drug use. Emotional well-being. Home and relationship well-being. Sexual activity. Eating habits. History of falls. Memory and ability to understand (cognition). Work and work Statistician. Screening  You may have the following tests or measurements: Height, weight, and BMI. Blood pressure. Lipid and cholesterol levels. These may be checked every 5 years, or more frequently if you are over 22 years old. Skin check. Lung cancer screening. You may have this screening every year starting at age 70 if you have a 30-pack-year history of smoking and currently smoke or have quit within the past 15 years. Fecal occult blood test (FOBT) of the stool. You may have this test every year starting at age 70. Flexible sigmoidoscopy or colonoscopy. You may have a sigmoidoscopy every 5 years or a colonoscopy every 10 years starting at age 70. Prostate cancer screening. Recommendations will vary depending on your family history and other risks. Hepatitis C blood test. Hepatitis B blood test. Sexually transmitted disease (STD) testing. Diabetes screening. This is done by checking your blood sugar (glucose) after you have not eaten for a while (fasting). You may have this done every 1-3 years. Abdominal aortic aneurysm (AAA) screening. You may need this if you are a current or former smoker.  Osteoporosis. You may be screened starting at age 70 if you are at high risk. Talk with your health care provider about your test results, treatment options, and if necessary, the need for more tests. Vaccines  Your health care provider may recommend certain vaccines, such as: Influenza vaccine. This is recommended every year. Tetanus, diphtheria, and acellular pertussis  (Tdap, Td) vaccine. You may need a Td booster every 10 years. Zoster vaccine. You may need this after age 70. Pneumococcal 13-valent conjugate (PCV13) vaccine. One dose is recommended after age 70. Pneumococcal polysaccharide (PPSV23) vaccine. One dose is recommended after age 70. Talk to your health care provider about which screenings and vaccines you need and how often you need them. This information is not intended to replace advice given to you by your health care provider. Make sure you discuss any questions you have with your health care provider. Document Released: 02/07/2015 Document Revised: 10/01/2015 Document Reviewed: 11/12/2014 Elsevier Interactive Patient Education  2017 [employer] Prevention in the Home Falls can cause injuries. They can happen to people of all ages. There are many things you can do to make your home safe and to help prevent falls. What can I do on the outside of my home? Regularly fix the edges of walkways and driveways and fix any cracks. Remove anything that might make you trip as you walk through a door, such as a raised step or threshold. Trim any bushes or trees on the path to your home. Use bright outdoor lighting. Clear any walking paths of anything that might make someone trip, such as rocks or tools. Regularly check to see if handrails are loose or broken. Make sure that both sides of any steps have handrails. Any raised decks and porches should have guardrails on the edges. Have any leaves, snow, or ice cleared regularly. Use sand or salt on walking paths during winter. Clean up any spills in your garage right away. This includes oil or grease spills. What can I do in the bathroom? Use night lights. Install grab bars by the toilet and in the tub and shower. Do not use towel bars as grab bars. Use non-skid mats or decals in the tub or shower. If you need to sit down in the shower, use a plastic, non-slip stool. Keep the floor dry. Clean  up any water that spills on the floor as soon as it happens. Remove soap buildup in the tub or shower regularly. Attach bath mats securely with double-sided non-slip rug tape. Do not have throw rugs and other things on the floor that can make you trip. What can I do in the bedroom? Use night lights. Make sure that you have a light by your bed that is easy to reach. Do not use any sheets or blankets that are too big for your bed. They should not hang down onto the floor. Have a firm chair that has side arms. You can use this for support while you get dressed. Do not have throw rugs and other things on the floor that can make you trip. What can I do in the kitchen? Clean up any spills right away. Avoid walking on wet floors. Keep items that you use a lot in easy-to-reach places. If you need to reach something above you, use a strong step stool that has a grab bar. Keep electrical cords out of the way. Do not use floor polish or wax that makes floors slippery. If you must use wax, use non-skid floor  wax. Do not have throw rugs and other things on the floor that can make you trip. What can I do with my stairs? Do not leave any items on the stairs. Make sure that there are handrails on both sides of the stairs and use them. Fix handrails that are broken or loose. Make sure that handrails are as long as the stairways. Check any carpeting to make sure that it is firmly attached to the stairs. Fix any carpet that is loose or worn. Avoid having throw rugs at the top or bottom of the stairs. If you do have throw rugs, attach them to the floor with carpet tape. Make sure that you have a light switch at the top of the stairs and the bottom of the stairs. If you do not have them, ask someone to add them for you. What else can I do to help prevent falls? Wear shoes that: Do not have high heels. Have rubber bottoms. Are comfortable and fit you well. Are closed at the toe. Do not wear sandals. If you  use a stepladder: Make sure that it is fully opened. Do not climb a closed stepladder. Make sure that both sides of the stepladder are locked into place. Ask someone to hold it for you, if possible. Clearly mark and make sure that you can see: Any grab bars or handrails. First and last steps. Where the edge of each step is. Use tools that help you move around (mobility aids) if they are needed. These include: Canes. Walkers. Scooters. Crutches. Turn on the lights when you go into a dark area. Replace any light bulbs as soon as they burn out. Set up your furniture so you have a clear path. Avoid moving your furniture around. If any of your floors are uneven, fix them. If there are any pets around you, be aware of where they are. Review your medicines with your doctor. Some medicines can make you feel dizzy. This can increase your chance of falling. Ask your doctor what other things that you can do to help prevent falls. This information is not intended to replace advice given to you by your health care provider. Make sure you discuss any questions you have with your health care provider. Document Released: 11/07/2008 Document Revised: 06/19/2015 Document Reviewed: 02/15/2014 Elsevier Interactive Patient Education  2017 [employer].

## 2022-05-12 ENCOUNTER — Telehealth: Payer: Self-pay | Admitting: Family Medicine

## 2022-05-12 MED ORDER — COLCHICINE 0.6 MG PO TABS: 0.6000 mg | ORAL_TABLET | Freq: Every day | ORAL | 0 refills | Status: DC | PRN

## 2022-05-12 NOTE — Telephone Encounter (Signed)
Sent. Thanks.  I hope he feels better soon. If not, then please let us know.

## 2022-05-12 NOTE — Addendum Note (Signed)
Addended by: Joaquim Nam on: 05/12/2022 09:00 PM   Modules accepted: Orders

## 2022-05-12 NOTE — Telephone Encounter (Signed)
Prescription Request  05/12/2022  LOV: 12/15/2021  What is the name of the medication or equipment? colchicine 0.6 MG tablet [[id number]]   Have you contacted your pharmacy to request a refill? No   Which pharmacy would you like this sent to?  [id number], [place] - [address] [place] [address] Phone: [phone] Fax: [phone]    Patient notified that their request is being sent to the clinical staff for review and that they should receive a response within 2 business days.   Please advise at Mobile [phone] (mobile)  Pt states the meds are for his gout & is currently having a breakout.

## 2022-05-12 NOTE — Telephone Encounter (Signed)
Medication is not on his current med list; okay to fill?

## 2022-06-04 ENCOUNTER — Other Ambulatory Visit: Payer: Self-pay | Admitting: Family Medicine

## 2022-06-04 DIAGNOSIS — L821 Other seborrheic keratosis: Secondary | ICD-10-CM | POA: Diagnosis not present

## 2022-06-04 DIAGNOSIS — Z09 Encounter for follow-up examination after completed treatment for conditions other than malignant neoplasm: Secondary | ICD-10-CM | POA: Diagnosis not present

## 2022-06-04 DIAGNOSIS — L578 Other skin changes due to chronic exposure to nonionizing radiation: Secondary | ICD-10-CM | POA: Diagnosis not present

## 2022-06-04 DIAGNOSIS — L814 Other melanin hyperpigmentation: Secondary | ICD-10-CM | POA: Diagnosis not present

## 2022-06-04 DIAGNOSIS — L570 Actinic keratosis: Secondary | ICD-10-CM | POA: Diagnosis not present

## 2022-06-04 DIAGNOSIS — D225 Melanocytic nevi of trunk: Secondary | ICD-10-CM | POA: Diagnosis not present

## 2022-06-04 DIAGNOSIS — B001 Herpesviral vesicular dermatitis: Secondary | ICD-10-CM | POA: Diagnosis not present

## 2022-06-04 DIAGNOSIS — L218 Other seborrheic dermatitis: Secondary | ICD-10-CM | POA: Diagnosis not present

## 2022-07-20 ENCOUNTER — Other Ambulatory Visit: Payer: Self-pay | Admitting: Family Medicine

## 2022-08-17 ENCOUNTER — Telehealth: Payer: Self-pay

## 2022-08-17 NOTE — Telephone Encounter (Signed)
Reached out to patient and scheduled follow up visit.  Bryce Villanueva [facility] Health Specialist

## 2022-09-03 ENCOUNTER — Ambulatory Visit: Payer: PPO | Admitting: Family Medicine

## 2022-09-03 ENCOUNTER — Encounter: Payer: Self-pay | Admitting: Family Medicine

## 2022-09-03 VITALS — BP 122/70 | HR 86 | Temp 98.1°F | Ht 69.0 in | Wt 184.0 lb

## 2022-09-03 DIAGNOSIS — I10 Essential (primary) hypertension: Secondary | ICD-10-CM | POA: Diagnosis not present

## 2022-09-03 DIAGNOSIS — M109 Gout, unspecified: Secondary | ICD-10-CM | POA: Diagnosis not present

## 2022-09-03 DIAGNOSIS — G2581 Restless legs syndrome: Secondary | ICD-10-CM | POA: Diagnosis not present

## 2022-09-03 DIAGNOSIS — Z0000 Encounter for general adult medical examination without abnormal findings: Secondary | ICD-10-CM

## 2022-09-03 DIAGNOSIS — Z7189 Other specified counseling: Secondary | ICD-10-CM

## 2022-09-03 LAB — CBC WITH DIFFERENTIAL/PLATELET
Basophils Absolute: 0 10*3/uL (ref 0.0–0.1)
Basophils Relative: 0.6 % (ref 0.0–3.0)
Eosinophils Absolute: 0.2 10*3/uL (ref 0.0–0.7)
Eosinophils Relative: 2.8 % (ref 0.0–5.0)
HCT: 44.8 % (ref 39.0–52.0)
Hemoglobin: 14.8 g/dL (ref 13.0–17.0)
Lymphocytes Relative: 37.5 % (ref 12.0–46.0)
Lymphs Abs: 2.8 10*3/uL (ref 0.7–4.0)
MCHC: 33 g/dL (ref 30.0–36.0)
MCV: 90 fl (ref 78.0–100.0)
Monocytes Absolute: 0.6 10*3/uL (ref 0.1–1.0)
Monocytes Relative: 8 % (ref 3.0–12.0)
Neutro Abs: 3.8 10*3/uL (ref 1.4–7.7)
Neutrophils Relative %: 51.1 % (ref 43.0–77.0)
Platelets: 223 10*3/uL (ref 150.0–400.0)
RBC: 4.98 Mil/uL (ref 4.22–5.81)
RDW: 13.4 % (ref 11.5–15.5)
WBC: 7.4 10*3/uL (ref 4.0–10.5)

## 2022-09-03 LAB — LIPID PANEL
Cholesterol: 220 mg/dL — ABNORMAL HIGH (ref 0–200)
HDL: 52.9 mg/dL (ref >39.00)
LDL Cholesterol: 144 mg/dL — ABNORMAL HIGH (ref 0–99)
NonHDL: 167.2
Total CHOL/HDL Ratio: 4
Triglycerides: 114 mg/dL (ref 0.0–149.0)
VLDL: 22.8 mg/dL (ref 0.0–40.0)

## 2022-09-03 LAB — TSH: TSH: 3.04 u[IU]/mL (ref 0.35–5.50)

## 2022-09-03 LAB — COMPREHENSIVE METABOLIC PANEL
ALT: 10 U/L (ref 0–53)
AST: 17 U/L (ref 0–37)
Albumin: 4.7 g/dL (ref 3.5–5.2)
Alkaline Phosphatase: 59 U/L (ref 39–117)
BUN: 22 mg/dL (ref 6–23)
CO2: 27 mEq/L (ref 19–32)
Calcium: 9.8 mg/dL (ref 8.4–10.5)
Chloride: 101 mEq/L (ref 96–112)
Creatinine, Ser: 1.17 mg/dL (ref 0.40–1.50)
GFR: 63.39 mL/min (ref >60.00)
Glucose, Bld: 86 mg/dL (ref 70–99)
Potassium: 4.3 mEq/L (ref 3.5–5.1)
Sodium: 137 mEq/L (ref 135–145)
Total Bilirubin: 0.9 mg/dL (ref 0.2–1.2)
Total Protein: 7.1 g/dL (ref 6.0–8.3)

## 2022-09-03 LAB — URIC ACID: Uric Acid, Serum: 5.9 mg/dL (ref 4.0–7.8)

## 2022-09-03 MED ORDER — LOSARTAN POTASSIUM 25 MG PO TABS: 25.0000 mg | ORAL_TABLET | Freq: Every day | ORAL | 3 refills | Status: DC

## 2022-09-03 MED ORDER — CYCLOBENZAPRINE HCL 10 MG PO TABS: ORAL_TABLET | ORAL | 3 refills | Status: DC

## 2022-09-03 MED ORDER — COLCHICINE 0.6 MG PO TABS: 0.6000 mg | ORAL_TABLET | Freq: Every day | ORAL | 1 refills | Status: DC | PRN

## 2022-09-03 NOTE — Progress Notes (Unsigned)
Flu to be done this fall.   Shingles prev done.  RSV done PNA d/w pt.   Tetanus 2017 COVID-vaccine previously done Cologuard 2022 Prostate cancer screening and PSA options (with potential risks and benefits of testing vs not testing) were discussed along with recent recs/guidelines.  He declined testing PSA at this point. Advance directive-wife designated if patient were incapacitated  Hypertension:    Using medication without problems or lightheadedness: still can get lightheaded.   Chest pain with exertion:yes Edema:no Short of breath:no Intentional weight loss and he cut back on losartan, down to 25mg .    He cut out sweet tea and soda. He cut out candy.    RLS. Flexeril helps.  No ADE on med.  Sleeping better on med.  Improved on weight loss.    Gout.  Rare colchicine use.  It helps.  No ADE on med.    Meds, vitals, and allergies reviewed.   ROS: Per HPI unless specifically indicated in ROS section   GEN: nad, alert and oriented HEENT: ncat NECK: supple w/o LA CV: rrr.  PULM: ctab, no inc wob ABD: soft, +bs EXT: no edema SKIN: no acute rash

## 2022-09-03 NOTE — Patient Instructions (Addendum)
Try taking 12.5mg  of losartan if you can tolerate that.  If your BP is still controlled or if still lightheaded, you could try stopping it.  Update me as needed.  Take care.  Glad to see you. Go to the lab on the way out.   If you have mychart we'll likely use that to update you.

## 2022-09-05 NOTE — Assessment & Plan Note (Signed)
Flexeril helps.  No ADE on med.  Sleeping better on med.  Improved on weight loss.   Continue Flexeril as is.

## 2022-09-05 NOTE — Assessment & Plan Note (Signed)
Flu to be done this fall.   Shingles prev done.  RSV done PNA d/w pt.   Tetanus 2017 COVID-vaccine previously done Cologuard 2022 Prostate cancer screening and PSA options (with potential risks and benefits of testing vs not testing) were discussed along with recent recs/guidelines.  He declined testing PSA at this point. Advance directive-wife designated if patient were incapacitated

## 2022-09-05 NOTE — Assessment & Plan Note (Signed)
Continue as needed colchicine.

## 2022-09-05 NOTE — Assessment & Plan Note (Signed)
Intentional weight loss and he cut back on losartan, down to 25mg .   Still occasionally lightheaded.  See notes on labs. Would try taking 12.5mg  of losartan if he can tolerate that.  If BP is still controlled or if still lightheaded, he could try stopping it.  Update me as needed.

## 2022-09-05 NOTE — Assessment & Plan Note (Signed)
Advance directive- wife designated if patient were incapacitated.  

## 2022-11-06 ENCOUNTER — Other Ambulatory Visit: Payer: Self-pay | Admitting: Family Medicine

## 2022-11-17 DIAGNOSIS — H04123 Dry eye syndrome of bilateral lacrimal glands: Secondary | ICD-10-CM | POA: Diagnosis not present

## 2022-11-17 DIAGNOSIS — H524 Presbyopia: Secondary | ICD-10-CM | POA: Diagnosis not present

## 2022-11-17 DIAGNOSIS — H1045 Other chronic allergic conjunctivitis: Secondary | ICD-10-CM | POA: Diagnosis not present

## 2022-11-17 DIAGNOSIS — H40053 Ocular hypertension, bilateral: Secondary | ICD-10-CM | POA: Diagnosis not present

## 2022-11-17 DIAGNOSIS — H2513 Age-related nuclear cataract, bilateral: Secondary | ICD-10-CM | POA: Diagnosis not present

## 2022-11-17 DIAGNOSIS — H35033 Hypertensive retinopathy, bilateral: Secondary | ICD-10-CM | POA: Diagnosis not present

## 2022-11-17 DIAGNOSIS — H0102A Squamous blepharitis right eye, upper and lower eyelids: Secondary | ICD-10-CM | POA: Diagnosis not present

## 2022-11-17 DIAGNOSIS — H0102B Squamous blepharitis left eye, upper and lower eyelids: Secondary | ICD-10-CM | POA: Diagnosis not present

## 2022-11-30 ENCOUNTER — Other Ambulatory Visit: Payer: Self-pay | Admitting: Family Medicine

## 2022-12-16 DIAGNOSIS — L578 Other skin changes due to chronic exposure to nonionizing radiation: Secondary | ICD-10-CM | POA: Diagnosis not present

## 2022-12-16 DIAGNOSIS — Z09 Encounter for follow-up examination after completed treatment for conditions other than malignant neoplasm: Secondary | ICD-10-CM | POA: Diagnosis not present

## 2022-12-16 DIAGNOSIS — L570 Actinic keratosis: Secondary | ICD-10-CM | POA: Diagnosis not present

## 2022-12-16 DIAGNOSIS — D485 Neoplasm of uncertain behavior of skin: Secondary | ICD-10-CM | POA: Diagnosis not present

## 2022-12-30 ENCOUNTER — Ambulatory Visit (INDEPENDENT_AMBULATORY_CARE_PROVIDER_SITE_OTHER): Payer: PPO | Admitting: Family Medicine

## 2022-12-30 ENCOUNTER — Encounter: Payer: Self-pay | Admitting: Family Medicine

## 2022-12-30 VITALS — BP 142/82 | HR 61 | Temp 97.7°F | Ht 69.0 in | Wt 185.0 lb

## 2022-12-30 DIAGNOSIS — R41840 Attention and concentration deficit: Secondary | ICD-10-CM | POA: Diagnosis not present

## 2022-12-30 DIAGNOSIS — L989 Disorder of the skin and subcutaneous tissue, unspecified: Secondary | ICD-10-CM

## 2022-12-30 DIAGNOSIS — R399 Unspecified symptoms and signs involving the genitourinary system: Secondary | ICD-10-CM

## 2022-12-30 MED ORDER — TAMSULOSIN HCL 0.4 MG PO CAPS: 0.8000 mg | ORAL_CAPSULE | Freq: Every day | ORAL | 3 refills | Status: DC

## 2022-12-30 MED ORDER — LIDOCAINE-PRILOCAINE 2.5-2.5 % EX CREA: 1.0000 | TOPICAL_CREAM | CUTANEOUS | 0 refills | Status: DC | PRN

## 2022-12-30 NOTE — Patient Instructions (Addendum)
If you get lightheaded or a runny nose on flomax then let me know.   Let me see about options in the meantime.  Take care.  Glad to see you.

## 2022-12-30 NOTE — Progress Notes (Signed)
Had been taking losartan every other day and BP was usually controlled.    Flomax helped some initially but then effect wore off gradually.  D/w pt about inc dose.  He tried that intermittently and it helped.    Focus d/w pt.  "Been going on my whole life."  Present in school, when at work.  "I would have to read something 3 times."  He talked with family about it.  He is frequently distracted and has trouble focusing.  He tried a half tab of adderall and he clearly improved.  "I could focus and listen to customers."  I told him I wanted to consider options.  He had dermatology eval for skin lesion.  Has tx ongoing with fluorouracil.  Locally tender.  Had used lidocaine cream locally.     Meds, vitals, and allergies reviewed.   ROS: Per HPI unless specifically indicated in ROS section   Nad Ncat, healing biopsy site on R ear.  Neck supple, no LA Rrr Ctab Abd soft. Not ttp  Memory testing done at [facility].  2024, December, Thursday.  3/3 attention.  WORLD--->DRLOW 0/3 recall on initial testing--- he attributed that to lack of focus.   Can name all months in reverse.   3/3 recall with 2nd set of items.    35 minutes were devoted to patient care in this encounter (this includes time spent reviewing the patient's file/history, interviewing and examining the patient, counseling/reviewing plan with patient).

## 2023-01-02 ENCOUNTER — Telehealth: Payer: Self-pay | Admitting: Family Medicine

## 2023-01-02 DIAGNOSIS — R41840 Attention and concentration deficit: Secondary | ICD-10-CM | POA: Insufficient documentation

## 2023-01-02 NOTE — Assessment & Plan Note (Signed)
He can try Flomax 0.8 mg/day and update me as needed.

## 2023-01-02 NOTE — Assessment & Plan Note (Signed)
Discussed that he can use Emla cream at the biopsy site in the meantime.  Prescription sent.

## 2023-01-02 NOTE — Assessment & Plan Note (Signed)
Longstanding, suspect ADD.  Discussed with patient about options.  Clearly had response to stimulants.  He is 72 years old.  Mood is okay otherwise.  I told him I wanted to consider options.  He was hesitant about going for extensive ADD testing.  See following phone note.

## 2023-01-02 NOTE — Telephone Encounter (Signed)
Please check with patient. I printed 2 copies of the Adult ADHD Self-Report Scale (ASRS-v1.1).  I would like him and a family member to fill it out independently and then send back to me.  I believe him when he says he has concentration difficulties but I would like to get extra data in the meantime.  Thanks.

## 2023-01-03 NOTE — Telephone Encounter (Signed)
Unable to reach patient. Left voicemail to return call to our office.   ADHD print outs in envelope at AV's desk. Need to know if patient would like mailed or to pick up.

## 2023-01-04 NOTE — Telephone Encounter (Signed)
Returned call to patient to advise that paperwork will be mailed.

## 2023-01-04 NOTE — Telephone Encounter (Signed)
Spoke to pt, pt requested ppw be mailed to him. Call back # [phone]

## 2023-01-21 ENCOUNTER — Other Ambulatory Visit (HOSPITAL_COMMUNITY): Payer: Self-pay

## 2023-01-21 ENCOUNTER — Encounter (HOSPITAL_COMMUNITY): Payer: Self-pay | Admitting: Pharmacy Technician

## 2023-01-21 ENCOUNTER — Telehealth: Payer: Self-pay | Admitting: Pharmacy Technician

## 2023-01-21 NOTE — Telephone Encounter (Signed)
error 

## 2023-01-21 NOTE — Telephone Encounter (Signed)
Pharmacy Patient Advocate Encounter  Received notification from [facility] ADVANTAGE/RX ADVANCE that Prior Authorization for Lidocaine-Prilocaine 2.5-2.5% cream has been APPROVED from 01/21/2023 to 04/21/2023. Ran test claim, Copay is $5.00. This test claim was processed through [facility]- copay amounts may vary at other pharmacies due to pharmacy/plan contracts, or as the patient moves through the different stages of their insurance plan.   PA #/Case ID/Reference #: [id number] Key: [id number]

## 2023-01-27 ENCOUNTER — Other Ambulatory Visit: Payer: Self-pay | Admitting: Family Medicine

## 2023-01-27 DIAGNOSIS — F988 Other specified behavioral and emotional disorders with onset usually occurring in childhood and adolescence: Secondary | ICD-10-CM | POA: Insufficient documentation

## 2023-01-27 MED ORDER — AMPHETAMINE-DEXTROAMPHET ER 10 MG PO CP24: 10.0000 mg | ORAL_CAPSULE | Freq: Every day | ORAL | 0 refills | Status: DC

## 2023-01-27 NOTE — Progress Notes (Signed)
 Please update patient.  I looked back at his ADD scoring.  Would try adderall 10mg  per day.  I sent the rx.  Please update me in about 1 week re: effect and tolerance.  I would limit dose to 1 tab a day.  If he has trouble with tremor, insomnia, decreased appetite, let me know.  Please have him check his blood pressure and pulse after he has been taking the medication for few days.  Please have him report back about his blood pressure and pulse when he is updating us  about his symptoms.  Thanks.

## 2023-01-27 NOTE — Progress Notes (Signed)
 Patient notified

## 2023-02-23 DIAGNOSIS — L244 Irritant contact dermatitis due to drugs in contact with skin: Secondary | ICD-10-CM | POA: Diagnosis not present

## 2023-02-23 DIAGNOSIS — Z09 Encounter for follow-up examination after completed treatment for conditions other than malignant neoplasm: Secondary | ICD-10-CM | POA: Diagnosis not present

## 2023-02-23 DIAGNOSIS — L578 Other skin changes due to chronic exposure to nonionizing radiation: Secondary | ICD-10-CM | POA: Diagnosis not present

## 2023-02-23 DIAGNOSIS — L080 Pyoderma: Secondary | ICD-10-CM | POA: Diagnosis not present

## 2023-02-28 ENCOUNTER — Ambulatory Visit: Payer: PPO | Admitting: Family Medicine

## 2023-03-07 ENCOUNTER — Other Ambulatory Visit: Payer: Self-pay | Admitting: Family Medicine

## 2023-03-08 NOTE — Telephone Encounter (Signed)
Last office visit: 12/30/22 Next office visit: nothing scheduled Last refill:  amphetamine-dextroamphetamine (ADDERALL XR) 10 MG 24 hr capsule 01/27/23 30 capsules 0 refills

## 2023-03-09 MED ORDER — AMPHETAMINE-DEXTROAMPHET ER 10 MG PO CP24: 10.0000 mg | ORAL_CAPSULE | Freq: Every day | ORAL | 0 refills | Status: DC

## 2023-03-09 NOTE — Telephone Encounter (Signed)
Sent. Thanks.  Please get update from patient about use/effect.

## 2023-03-10 ENCOUNTER — Other Ambulatory Visit: Payer: Self-pay | Admitting: Family Medicine

## 2023-03-10 NOTE — Telephone Encounter (Signed)
Copied from CRM [id number]. Topic: General - Other >> Mar 10, 2023  3:29 PM Truddie Crumble wrote: Reason for CRM: patient returning call to [place] from the office

## 2023-03-10 NOTE — Telephone Encounter (Signed)
Left voicemail for patient to return call to office.

## 2023-03-11 NOTE — Telephone Encounter (Signed)
Spoke with patient he states that the medication is helping. He can focus a lot better in his meetings. His concentration is so much better. He hasn't noticed any side effects.

## 2023-03-11 NOTE — Telephone Encounter (Signed)
Last refill: CYCLOBENZAPRINE 10 MG TABLET 09/03/22 90 tablets 3 refills Last office visit: 12/30/22 Next office visit: nothing scheduled

## 2023-03-13 NOTE — Telephone Encounter (Signed)
 Noted. Thanks.

## 2023-03-18 ENCOUNTER — Ambulatory Visit: Payer: PPO

## 2023-03-18 VITALS — Ht 69.0 in | Wt 180.0 lb

## 2023-03-18 DIAGNOSIS — Z0000 Encounter for general adult medical examination without abnormal findings: Secondary | ICD-10-CM | POA: Diagnosis not present

## 2023-03-18 NOTE — Progress Notes (Signed)
 Subjective:   Bryce Villanueva is a 73 y.o. who presents for a Medicare Wellness preventive visit.  Visit Complete: Virtual I connected with  [name] on 03/18/23 by a audio enabled telemedicine application and verified that I am speaking with the correct person using two identifiers.  Patient Location: Home  Provider Location: Home Office  I discussed the limitations of evaluation and management by telemedicine. The patient expressed understanding and agreed to proceed.  Vital Signs: Because this visit was a virtual/telehealth visit, some criteria may be missing or patient reported. Any vitals not documented were not able to be obtained and vitals that have been documented are patient reported.  VideoDeclined- This patient declined Librarian, academic. Therefore the visit was completed with audio only.  AWV Questionnaire: Yes: Patient Medicare AWV questionnaire was completed by the patient on 03/14/23; I have confirmed that all information answered by patient is correct and no changes since this date.  Cardiac Risk Factors include: advanced age (>84men, >23 women);dyslipidemia;hypertension;male gender    Objective:    Today's Vitals   03/18/23 1545  Weight: 180 lb (81.6 kg)  Height: 5\' 9"  (1.753 m)   Body mass index is 26.58 kg/m.     03/18/2023    3:53 PM 03/15/2022    3:42 PM 03/13/2021    9:43 AM 11/02/2011    6:10 AM  Advanced Directives  Does Patient Have a Medical Advance Directive? Yes Yes Yes Patient does not have advance directive;Patient would not like information  Type of Public librarian Power of [place];Living will  Healthcare Power of [place];Living will   Does patient want to make changes to medical advance directive?   Yes (MAU/Ambulatory/Procedural Areas - Information given)   Copy of Healthcare Power of Attorney in Chart? No - copy requested     Pre-existing out of facility DNR order (yellow form or pink MOST form)     No    Current Medications (verified) Outpatient Encounter Medications as of 03/18/2023  Medication Sig   albuterol (VENTOLIN HFA) 108 (90 Base) MCG/ACT inhaler TAKE 2 PUFFS BY MOUTH EVERY 6 HOURS AS NEEDED FOR COUGH   amphetamine-dextroamphetamine (ADDERALL XR) 10 MG 24 hr capsule Take 1 capsule (10 mg total) by mouth daily.   colchicine 0.6 MG tablet Take 1 tablet (0.6 mg total) by mouth daily as needed.   cyclobenzaprine (FLEXERIL) 10 MG tablet TAKE 1/2 TO 1 TABLET BY MOUTH TWICE A DAY AS NEEDED FOR MUSCLE SPASMS OR SLEEP (CAUTION: SEDATION)   desonide (DESOWEN) 0.05 % cream APPLY ON THE SKIN TWICE DAILY AS NEEDED   famciclovir (FAMVIR) 250 MG tablet TAKE 2 TABLETS BY MOUTH DAILY FOR 1 DAY, THEN 1 TABLET BY MOUTH DAILY AS NEEDED FOR FLARES   fluticasone (FLONASE) 50 MCG/ACT nasal spray SPRAY 2 SPRAYS INTO EACH NOSTRIL EVERY DAY   lidocaine-prilocaine (EMLA) cream Apply 1 Application topically as needed (at night).   losartan (COZAAR) 25 MG tablet Take 1 tablet (25 mg total) by mouth daily.   nystatin (MYCOSTATIN/NYSTOP) powder Apply topically 2 (two) times daily as needed.   tamsulosin (FLOMAX) 0.4 MG CAPS capsule Take 2 capsules (0.8 mg total) by mouth daily.   TOLAK 4 % CREA SMARTSIG:Right Ear Twice Daily PRN   No facility-administered encounter medications on file as of 03/18/2023.    Allergies (verified) Amlodipine, Lisinopril, and Pravastatin   History: Past Medical History:  Diagnosis Date   Biceps tendon tear RIGHT ARM   COVID-19  2020   Diverticulosis of colon    Hypertension    Kidney stones    Labral tear of shoulder    Past Surgical History:  Procedure Laterality Date   FOOT SURGERY  03/05/09   RIGHT ANKLE  BONE SPUR REMOVAL (Dr. Lestine Box)   SHOULDER SURGERY Right 2013   Family History  Problem Relation Age of Onset   Emphysema Father        smoker   Alcohol abuse Father    Esophageal cancer Sister    Hypertension Brother    Alcohol abuse Brother     Hypertension Brother    Pulmonary embolism Brother    Hypertension Son    Prostate cancer Neg Hx    Colon cancer Neg Hx    Social History   Socioeconomic History   Marital status: Married    Spouse name: Not on file   Number of children: 2   Years of education: Not on file   Highest education level: Associate degree: occupational, Scientist, product/process development, or vocational program  Occupational History   Occupation: Psychologist, prison and probation services: [employer]  Tobacco Use   Smoking status: Never    Passive exposure: Past   Smokeless tobacco: Never  Substance and Sexual Activity   Alcohol use: Yes    Alcohol/week: 1.0 standard drink of alcohol    Types: 1 Standard drinks or equivalent per week    Comment: one drink on the weekends   Drug use: No   Sexual activity: Not on file  Other Topics Concern   Not on file  Social History Narrative   2 biological children and 2 stepchildren   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, did Designer, multimedia   Social Drivers of Health   Financial Resource Strain: Low Risk  (03/14/2023)   Overall Financial Resource Strain (CARDIA)    Difficulty of Paying Living Expenses: Not hard at all  Food Insecurity: No Food Insecurity (03/14/2023)   Hunger Vital Sign    Worried About Running Out of Food in the Last Year: Never true    Ran Out of Food in the Last Year: Never true  Transportation Needs: No Transportation Needs (03/14/2023)   PRAPARE - Administrator, Civil Service (Medical): No    Lack of Transportation (Non-Medical): No  Physical Activity: Sufficiently Active (03/14/2023)   Exercise Vital Sign    Days of Exercise per Week: 5 days    Minutes of Exercise per Session: 150+ min  Stress: No Stress Concern Present (03/14/2023)   [employer] of Occupational Health - Occupational Stress Questionnaire    Feeling of Stress : Not at all  Social Connections: Moderately Integrated (03/14/2023)   Social Connection and Isolation Panel  [NHANES]    Frequency of Communication with Friends and Family: Twice a week    Frequency of Social Gatherings with Friends and Family: Three times a week    Attends Religious Services: More than 4 times per year    Active Member of Clubs or Organizations: No    Attends Engineer, structural: Not on file    Marital Status: Married  Recent Concern: Social Connections - Moderately Isolated (12/27/2022)   Social Connection and Isolation Panel [NHANES]    Frequency of Communication with Friends and Family: Once a week    Frequency of Social Gatherings with Friends and Family: Once a week    Attends Religious Services: More than 4 times per year    Active Member of Clubs or  Organizations: No    Attends Engineer, structural: Not on file    Marital Status: Married    Tobacco Counseling Counseling given: Not Answered    Clinical Intake:  Pre-visit preparation completed: Yes  Pain : No/denies pain     BMI - recorded: 26.58 Nutritional Status: BMI 25 -29 Overweight Nutritional Risks: None Diabetes: No  How often do you need to have someone help you when you read instructions, pamphlets, or other written materials from your doctor or pharmacy?: 1 - Never  Interpreter Needed?: No  Comments: lives with wife Information entered by :: B.Jonavan Vanhorn,LPN   Activities of Daily Living     03/14/2023    3:29 PM  In your present state of health, do you have any difficulty performing the following activities:  Hearing? 1  Vision? 0  Difficulty concentrating or making decisions? 0  Walking or climbing stairs? 0  Dressing or bathing? 0  Doing errands, shopping? 0  Preparing Food and eating ? N  Using the Toilet? N  In the past six months, have you accidently leaked urine? N  Do you have problems with loss of bowel control? N  Managing your Medications? N  Managing your Finances? N  Housekeeping or managing your Housekeeping? N    Patient Care Team: Joaquim Nam, MD as PCP - General (Family Medicine) [facility]  Indicate any recent Medical Services you may have received from other than [facility] providers in the past year (date may be approximate).     Assessment:   This is a routine wellness examination for [employer].  Hearing/Vision screen Hearing Screening - Comments:: Pt says his hearing says is not that good:has hearing aids Vision Screening - Comments:: Pt says his vision is pretty good w/glasses [facility]   Goals Addressed             This Visit's Progress    COMPLETED: Patient Stated   On track    Would like to start exercising at the gym        Depression Screen     03/18/2023    3:50 PM 09/03/2022    9:59 AM 03/15/2022    3:41 PM 07/14/2021    3:46 PM 03/13/2021    9:52 AM 12/12/2020    9:57 AM 12/07/2019    8:35 AM  PHQ 2/9 Scores  PHQ - 2 Score 0 0 0 0 0 0 0  PHQ- 9 Score  0         Fall Risk     03/14/2023    3:29 PM 09/03/2022    9:58 AM 03/11/2022   11:15 AM 07/14/2021    3:46 PM 03/13/2021    9:49 AM  Fall Risk   Falls in the past year? 0 0 0 0 0  Number falls in past yr: 0 0 0 0 0  Injury with Fall? 0 0 0 0 0  Risk for fall due to : No Fall Risks No Fall Risks  No Fall Risks No Fall Risks  Follow up Education provided;Falls prevention discussed Falls evaluation completed  Falls evaluation completed Falls prevention discussed    MEDICARE RISK AT HOME:  Medicare Risk at Home Any stairs in or around the home?: (Patient-Rptd) No Home free of loose throw rugs in walkways, pet beds, electrical cords, etc?: (Patient-Rptd) No Adequate lighting in your home to reduce risk of falls?: (Patient-Rptd) Yes Life alert?: (Patient-Rptd) No Use of a cane, walker or w/c?: (Patient-Rptd)  No Grab bars in the bathroom?: (Patient-Rptd) Yes Shower chair or bench in shower?: (Patient-Rptd) No Elevated toilet seat or a handicapped toilet?: (Patient-Rptd) No  TIMED UP AND GO:  Was the test performed?   No  Cognitive Function: 6CIT completed        03/18/2023    4:20 PM 03/15/2022    3:46 PM  6CIT Screen  What Year? 0 points 0 points  What month? 0 points 0 points  What time? 0 points 0 points  Count back from 20 0 points 0 points  Months in reverse 0 points 0 points  Repeat phrase 0 points 0 points  Total Score 0 points 0 points    Immunizations Immunization History  Administered Date(s) Administered   Fluad Quad(high Dose 65+) 10/02/2021   Fluad Trivalent(High Dose 65+) 10/08/2022   Influenza Whole 12/25/2008   Influenza, High Dose Seasonal PF 10/17/2017, 10/25/2018, 10/05/2019, 10/10/2020   Influenza,inj,Quad PF,6+ Mos 10/16/2016   Influenza-Unspecified 11/28/2015, 10/16/2016   PFIZER Comirnaty(Gray Top)Covid-19 Tri-Sucrose Vaccine 06/04/2020   PFIZER(Purple Top)SARS-COV-2 Vaccination 03/05/2019, 03/30/2019, 10/22/2019   PNEUMOCOCCAL CONJUGATE-20 01/04/2023   Pfizer Covid-19 Vaccine Bivalent Booster 12yrs & up 02/06/2021   Pneumococcal Polysaccharide-23 10/24/2017, 11/12/2019   Respiratory Syncytial Virus Vaccine,Recomb Aduvanted(Arexvy) 03/26/2022   Td 06/25/2005   Tdap 07/31/2015   Zoster Recombinant(Shingrix) 05/30/2020, 09/11/2020    Screening Tests Health Maintenance  Topic Date Due   Fecal DNA (Cologuard)  01/15/2024   Medicare Annual Wellness (AWV)  03/17/2024   DTaP/Tdap/Td (3 - Td or Tdap) 07/30/2025   Pneumonia Vaccine 47+ Years old  Completed   INFLUENZA VACCINE  Completed   Hepatitis C Screening  Completed   Zoster Vaccines- Shingrix  Completed   HPV VACCINES  Aged Out   COVID-19 Vaccine  Discontinued    Health Maintenance  There are no preventive care reminders to display for this patient. Health Maintenance Items Addressed:  Additional Screening:  Vision Screening: Recommended annual ophthalmology exams for early detection of glaucoma and other disorders of the eye.  Dental Screening: Recommended annual dental exams for proper oral  hygiene  Community Resource Referral / Chronic Care Management: CRR required this visit?  No   CCM required this visit?  No Pt declined to make PE appt.: says he will make it later     Plan:     I have personally reviewed and noted the following in the patient's chart:   Medical and social history Use of alcohol, tobacco or illicit drugs  Current medications and supplements including opioid prescriptions. Patient is not currently taking opioid prescriptions. Functional ability and status Nutritional status Physical activity Advanced directives List of other physicians Hospitalizations, surgeries, and ER visits in previous 12 months Vitals Screenings to include cognitive, depression, and falls Referrals and appointments  In addition, I have reviewed and discussed with patient certain preventive protocols, quality metrics, and best practice recommendations. A written personalized care plan for preventive services as well as general preventive health recommendations were provided to patient.    Sue Lush, LPN   1/61/0960   After Visit Summary: (MyChart) Due to this being a telephonic visit, the after visit summary with patients personalized plan was offered to patient via MyChart   Notes: Nothing significant to report at this time.

## 2023-03-18 NOTE — Patient Instructions (Signed)
 Bryce Villanueva , Thank you for taking time to come for your Medicare Wellness Visit. I appreciate your ongoing commitment to your health goals. Please review the following plan we discussed and let me know if I can assist you in the future.   Referrals/Orders/Follow-Ups/Clinician Recommendations: none  This is a list of the screening recommended for you and due dates:  Health Maintenance  Topic Date Due   Cologuard (Stool DNA test)  01/15/2024   Medicare Annual Wellness Visit  03/17/2024   DTaP/Tdap/Td vaccine (3 - Td or Tdap) 07/30/2025   Pneumonia Vaccine  Completed   Flu Shot  Completed   Hepatitis C Screening  Completed   Zoster (Shingles) Vaccine  Completed   HPV Vaccine  Aged Out   COVID-19 Vaccine  Discontinued    Advanced directives: (Copy Requested) Please bring a copy of your health care power of attorney and living will to the office to be added to your chart at your convenience.  Next Medicare Annual Wellness Visit scheduled for next year: Yes 03/20/2024 @3 :40pm televisit

## 2023-04-08 DIAGNOSIS — Z872 Personal history of diseases of the skin and subcutaneous tissue: Secondary | ICD-10-CM | POA: Diagnosis not present

## 2023-04-08 DIAGNOSIS — Z09 Encounter for follow-up examination after completed treatment for conditions other than malignant neoplasm: Secondary | ICD-10-CM | POA: Diagnosis not present

## 2023-04-13 ENCOUNTER — Other Ambulatory Visit: Payer: Self-pay | Admitting: Family Medicine

## 2023-04-15 MED ORDER — AMPHETAMINE-DEXTROAMPHET ER 10 MG PO CP24: 10.0000 mg | ORAL_CAPSULE | Freq: Every day | ORAL | 0 refills | Status: DC

## 2023-05-14 ENCOUNTER — Other Ambulatory Visit: Payer: Self-pay | Admitting: Family Medicine

## 2023-05-16 MED ORDER — AMPHETAMINE-DEXTROAMPHET ER 10 MG PO CP24: 10.0000 mg | ORAL_CAPSULE | Freq: Every day | ORAL | 0 refills | Status: DC

## 2023-06-15 ENCOUNTER — Encounter: Payer: Self-pay | Admitting: Family Medicine

## 2023-06-16 ENCOUNTER — Telehealth (INDEPENDENT_AMBULATORY_CARE_PROVIDER_SITE_OTHER): Admitting: Internal Medicine

## 2023-06-16 ENCOUNTER — Telehealth: Payer: Self-pay | Admitting: Family Medicine

## 2023-06-16 ENCOUNTER — Other Ambulatory Visit: Payer: Self-pay | Admitting: Family Medicine

## 2023-06-16 ENCOUNTER — Ambulatory Visit: Payer: Self-pay

## 2023-06-16 DIAGNOSIS — F988 Other specified behavioral and emotional disorders with onset usually occurring in childhood and adolescence: Secondary | ICD-10-CM

## 2023-06-16 MED ORDER — AMPHETAMINE-DEXTROAMPHET ER 10 MG PO CP24: 10.0000 mg | ORAL_CAPSULE | Freq: Every day | ORAL | 0 refills | Status: DC

## 2023-06-16 NOTE — Telephone Encounter (Signed)
 Created a NT encounter over med concerns. Erroneous encounter

## 2023-06-16 NOTE — Telephone Encounter (Signed)
 FYI

## 2023-06-16 NOTE — Telephone Encounter (Signed)
 LOV: 12/30/22 [id number] scheduled LAST REFILL:amphetamine -dextroamphetamine (ADDERALL XR) 10 MG 24 hr capsule  05/16/23 30 capsules 0 refills

## 2023-06-16 NOTE — Assessment & Plan Note (Signed)
 Visit canceled

## 2023-06-16 NOTE — Progress Notes (Signed)
   Subjective:    Patient ID: Bryce Villanueva, male    DOB: 1950-11-24, 73 y.o.   MRN: [id number]  HPI Visit canceled    Review of Systems     Objective:   Physical Exam         Assessment & Plan:

## 2023-06-16 NOTE — Telephone Encounter (Signed)
 I sent the rx today.  I don't see where the request came in before today.  Please check with pharmacy.  Thanks.

## 2023-06-16 NOTE — Telephone Encounter (Signed)
  Chief Complaint: attention deficient issues   Disposition: [] ED /[] Urgent Care (no appt availability in office) / [x] Appointment(In office/virtual)/ []  [facility]/ [] Home Care/ [] Refused Recommended Disposition /[] [place]/ []  Follow-up with PCP Additional Notes: Pt has concerns of running out of Adderall medication. Pt has been trying to get refill with no luck. Pt states he has been on Adderall since 12/2022 and has seen much improvement.  Pt has video app today at 1600 with Dr Joelle Musca to address concerns.  RN gave care advice and pt verbalized understanding.           Reason for Disposition  Nursing judgment or information in reference  Answer Assessment - Initial Assessment Questions 1. REASON FOR CALL: "What is your main concern right now?"     Adderall refill 2. ONSET: "When did the  start?"     Born with it 3. SEVERITY: "How bad is the ?"     Can't focus  4. FEVER: "Do you have a fever?"     Na  5. OTHER SYMPTOMS: "Do you have any other new symptoms?"     Hyper  Protocols used: No Guideline Available-A-AH

## 2023-06-20 ENCOUNTER — Other Ambulatory Visit: Payer: Self-pay | Admitting: Family Medicine

## 2023-07-01 ENCOUNTER — Other Ambulatory Visit (HOSPITAL_COMMUNITY): Payer: Self-pay

## 2023-07-01 ENCOUNTER — Telehealth: Payer: Self-pay

## 2023-07-01 NOTE — Telephone Encounter (Signed)
 Pharmacy Patient Advocate Encounter  Received notification from [facility] ADVANTAGE/RX ADVANCE that Prior Authorization for Lidocaine -Prilocaine  2.5-2.5% cream has been DENIED.  Full denial letter will be uploaded to the media tab. See denial reason below.   PA #/Case ID/Reference #: [id number]

## 2023-07-01 NOTE — Telephone Encounter (Signed)
 Pharmacy Patient Advocate Encounter   Received notification from CoverMyMeds that prior authorization for Lidocaine -Prilocaine  2.5-2.5% cream is required/requested.   Insurance verification completed.   The patient is insured through [facility] ADVANTAGE/RX ADVANCE .   Per test claim: PA required; PA submitted to above mentioned insurance via CoverMyMeds Key/confirmation #/EOC [id number] Status is pending

## 2023-07-04 NOTE — Telephone Encounter (Signed)
 Please notify pt.  Please have him check with pharmacy to see if they have alternatives.  Thanks.

## 2023-07-05 NOTE — Telephone Encounter (Signed)
 Spoke with patient who is aware of the denial. He states to not worry about it because it was only $20 dollars so he will just pay for it out of pocker

## 2023-07-05 NOTE — Telephone Encounter (Signed)
 Noted. Thanks.

## 2023-07-13 DIAGNOSIS — L814 Other melanin hyperpigmentation: Secondary | ICD-10-CM | POA: Diagnosis not present

## 2023-07-13 DIAGNOSIS — L538 Other specified erythematous conditions: Secondary | ICD-10-CM | POA: Diagnosis not present

## 2023-07-13 DIAGNOSIS — L821 Other seborrheic keratosis: Secondary | ICD-10-CM | POA: Diagnosis not present

## 2023-07-13 DIAGNOSIS — I788 Other diseases of capillaries: Secondary | ICD-10-CM | POA: Diagnosis not present

## 2023-07-13 DIAGNOSIS — D225 Melanocytic nevi of trunk: Secondary | ICD-10-CM | POA: Diagnosis not present

## 2023-07-13 DIAGNOSIS — D485 Neoplasm of uncertain behavior of skin: Secondary | ICD-10-CM | POA: Diagnosis not present

## 2023-07-13 DIAGNOSIS — L820 Inflamed seborrheic keratosis: Secondary | ICD-10-CM | POA: Diagnosis not present

## 2023-07-14 ENCOUNTER — Other Ambulatory Visit: Payer: Self-pay | Admitting: Family Medicine

## 2023-07-14 MED ORDER — AMPHETAMINE-DEXTROAMPHET ER 10 MG PO CP24: 10.0000 mg | ORAL_CAPSULE | Freq: Every day | ORAL | 0 refills | Status: DC

## 2023-07-14 NOTE — Telephone Encounter (Signed)
 Copied from CRM [id number]. Topic: Clinical - Medication Refill >> Jul 14, 2023  3:58 PM [place] E wrote: Medication: amphetamine -dextroamphetamine (ADDERALL XR) 10 MG 24 hr capsule  Has the patient contacted their pharmacy? Yes (Agent: If no, request that the patient contact the pharmacy for the refill. If patient does not wish to contact the pharmacy document the reason why and proceed with request.) (Agent: If yes, when and what did the pharmacy advise?)  This is the patient's preferred pharmacy:  [facility] [phone] [facility], [place] - [address] [place] [address] Phone: [phone] Fax: [phone]  Is this the correct pharmacy for this prescription? Yes If no, delete pharmacy and type the correct one.   Has the prescription been filled recently? Yes  Is the patient out of the medication? No, 6 pills left.  Has the patient been seen for an appointment in the last year OR does the patient have an upcoming appointment? Yes  Can we respond through MyChart? Yes  Agent: Please be advised that Rx refills may take up to 3 business days. We ask that you follow-up with your pharmacy.

## 2023-07-14 NOTE — Telephone Encounter (Signed)
 LOV: 12/30/22 [id number] SCHEDULED   LAST REFILL:Prescription amphetamine -dextroamphetamine (ADDERALL XR) 10 MG 24 hr capsule 06/16/23 30 capsules 0 refills

## 2023-07-14 NOTE — Telephone Encounter (Signed)
 Sent. Thanks.

## 2023-08-11 ENCOUNTER — Telehealth: Payer: Self-pay | Admitting: Family Medicine

## 2023-08-11 NOTE — Telephone Encounter (Unsigned)
 Copied from CRM [id number]. Topic: Clinical - Medication Refill >> Aug 11, 2023  1:44 PM [place] wrote: Medication: amphetamine -dextroamphetamine (ADDERALL XR) 10 MG 24 hr capsule  Has the patient contacted their pharmacy? Yes (Agent: If no, request that the patient contact the pharmacy for the refill. If patient does not wish to contact the pharmacy document the reason why and proceed with request.) (Agent: If yes, when and what did the pharmacy advise?)  This is the patient's preferred pharmacy:  [phone] [facility], [place] - [address] [place] [address] Phone: [phone] Fax: [phone]  Is this the correct pharmacy for this prescription? Yes If no, delete pharmacy and type the correct one.   Has the prescription been filled recently? Yes  Is the patient out of the medication? Yes  Has the patient been seen for an appointment in the last year OR does the patient have an upcoming appointment? Yes  Can we respond through MyChart? Yes  Agent: Please be advised that Rx refills may take up to 3 business days. We ask that you follow-up with your pharmacy.

## 2023-08-12 MED ORDER — AMPHETAMINE-DEXTROAMPHET ER 10 MG PO CP24: 10.0000 mg | ORAL_CAPSULE | Freq: Every day | ORAL | 0 refills | Status: DC

## 2023-08-12 NOTE — Telephone Encounter (Signed)
 Sent. Thanks.

## 2023-08-12 NOTE — Telephone Encounter (Unsigned)
 Copied from CRM [id number]. Topic: Clinical - Medication Question >> Aug 12, 2023  1:05 PM Aisha D wrote: Reason for CRM: Pt is calling to check the status of his medication request for the amphetamine -dextroamphetamine (ADDERALL XR) 10 MG 24 hr capsule. I informed the pt that the request is currently pending. Pt would like a callback once the medication has been approved.

## 2023-08-23 ENCOUNTER — Encounter: Payer: Self-pay | Admitting: Family Medicine

## 2023-08-23 ENCOUNTER — Ambulatory Visit (INDEPENDENT_AMBULATORY_CARE_PROVIDER_SITE_OTHER): Admitting: Family Medicine

## 2023-08-23 ENCOUNTER — Ambulatory Visit (INDEPENDENT_AMBULATORY_CARE_PROVIDER_SITE_OTHER)
Admission: RE | Admit: 2023-08-23 | Discharge: 2023-08-23 | Disposition: A | Discharge status: Home / Self Care | Source: Ambulatory Visit | Attending: Family Medicine | Admitting: Family Medicine

## 2023-08-23 VITALS — BP 130/80 | HR 92 | Temp 98.4°F | Resp 20 | Ht 69.0 in | Wt 184.4 lb

## 2023-08-23 DIAGNOSIS — M25539 Pain in unspecified wrist: Secondary | ICD-10-CM

## 2023-08-23 DIAGNOSIS — M25532 Pain in left wrist: Secondary | ICD-10-CM

## 2023-08-23 DIAGNOSIS — M25542 Pain in joints of left hand: Secondary | ICD-10-CM | POA: Diagnosis not present

## 2023-08-23 NOTE — Patient Instructions (Signed)
 Use voltaren gel in the meantime as needed and go by xray on the way out.  Take care.  Glad to see you.

## 2023-08-23 NOTE — Progress Notes (Unsigned)
 He is still taking adderall 10mg .  No ADE on med and he/his family noted an improvement.  He noticed that it helped overall.  Wrist pain.  Wrench slipped last year and hit his L wrist, near base of the L 1st MC.  Throbbing pain, ongoing, with bony changes locally compared to R wrist.  Tried a brace in the meantime, minimal relief.  Voltaren gel helps some.    Meds, vitals, and allergies reviewed.   ROS: Per HPI unless specifically indicated in ROS section   Nad Ncat Left hand with normal grip and normal flexion and extension but he has tenderness and an asymmetric bony prominence near the base of the left first metacarpal.  Sore locally.  Not tender over the carpals. No bruising or swelling.  Sensation intact.  Distally neurovascular intact.  X-ray pending.

## 2023-08-24 ENCOUNTER — Telehealth: Payer: Self-pay | Admitting: Family Medicine

## 2023-08-24 DIAGNOSIS — M25539 Pain in unspecified wrist: Secondary | ICD-10-CM | POA: Insufficient documentation

## 2023-08-24 NOTE — Telephone Encounter (Signed)
 Opened in error

## 2023-08-24 NOTE — Assessment & Plan Note (Signed)
 Reasonable to use Voltaren gel in the meantime.  See notes on plain films.  At this point okay for outpatient follow-up

## 2023-08-28 ENCOUNTER — Ambulatory Visit: Payer: Self-pay | Admitting: Family Medicine

## 2023-08-31 ENCOUNTER — Other Ambulatory Visit: Payer: Self-pay | Admitting: Family Medicine

## 2023-09-08 ENCOUNTER — Other Ambulatory Visit: Payer: Self-pay | Admitting: Family Medicine

## 2023-09-12 ENCOUNTER — Other Ambulatory Visit: Payer: Self-pay | Admitting: Family Medicine

## 2023-09-12 NOTE — Telephone Encounter (Unsigned)
 Copied from CRM #[id number]. Topic: Clinical - Medication Refill >> Sep 12, 2023  4:36 PM Chiquita SQUIBB wrote: Medication:  amphetamine -dextroamphetamine amphetamine -dextroamphetamine (ADDERALL XR) 10 MG 24 hr capsule   Has the patient contacted their pharmacy? Yes (Agent: If no, request that the patient contact the pharmacy for the refill. If patient does not wish to contact the pharmacy document the reason why and proceed with request.) (Agent: If yes, when and what did the pharmacy advise?)  This is the patient's preferred pharmacy:  [phone] [facility], [place] - [address] [place] [address] Phone: [phone] Fax: [phone]  Is this the correct pharmacy for this prescription? Yes If no, delete pharmacy and type the correct one.   Has the prescription been filled recently? No  Is the patient out of the medication? No  Has the patient been seen for an appointment in the last year OR does the patient have an upcoming appointment? Yes  Can we respond through MyChart? Yes  Agent: Please be advised that Rx refills may take up to 3 business days. We ask that you follow-up with your pharmacy.

## 2023-09-14 MED ORDER — AMPHETAMINE-DEXTROAMPHET ER 10 MG PO CP24: 10.0000 mg | ORAL_CAPSULE | Freq: Every day | ORAL | 0 refills | Status: DC

## 2023-10-12 ENCOUNTER — Other Ambulatory Visit: Payer: Self-pay | Admitting: Family Medicine

## 2023-10-12 DIAGNOSIS — F988 Other specified behavioral and emotional disorders with onset usually occurring in childhood and adolescence: Secondary | ICD-10-CM

## 2023-10-12 NOTE — Telephone Encounter (Unsigned)
 Copied from CRM [id number]. Topic: Clinical - Medication Refill >> Oct 12, 2023  2:18 PM Macario HERO wrote: Medication: amphetamine -dextroamphetamine (ADDERALL XR) 10 MG 24 hr capsule [[id number]]  Has the patient contacted their pharmacy? No (Agent: If no, request that the patient contact the pharmacy for the refill. If patient does not wish to contact the pharmacy document the reason why and proceed with request.) (Agent: If yes, when and what did the pharmacy advise?)  This is the patient's preferred pharmacy:  [phone] [facility], [place] - [address] [place] [address] Phone: [phone] Fax: [phone]  Is this the correct pharmacy for this prescription? Yes If no, delete pharmacy and type the correct one.   Has the prescription been filled recently? Yes  Is the patient out of the medication? Yes  Has the patient been seen for an appointment in the last year OR does the patient have an upcoming appointment? Yes  Can we respond through MyChart? Yes  Agent: Please be advised that Rx refills may take up to 3 business days. We ask that you follow-up with your pharmacy.

## 2023-10-14 MED ORDER — AMPHETAMINE-DEXTROAMPHET ER 10 MG PO CP24: 10.0000 mg | ORAL_CAPSULE | Freq: Every day | ORAL | 0 refills | Status: DC

## 2023-10-14 NOTE — Telephone Encounter (Signed)
 Name of Medication:  Adderall XR Name of Pharmacy:  [facility] Last Fill or Written Date and Quantity:  09/14/23, #30 Last Office Visit and Type:  08/23/23, wrist pain Next Office Visit and Type:  none Last Controlled Substance Agreement Date:  none Last UDS:  none

## 2023-11-10 ENCOUNTER — Other Ambulatory Visit: Payer: Self-pay | Admitting: Family Medicine

## 2023-11-10 DIAGNOSIS — F988 Other specified behavioral and emotional disorders with onset usually occurring in childhood and adolescence: Secondary | ICD-10-CM

## 2023-11-10 NOTE — Telephone Encounter (Signed)
 Copied from CRM #[id number]. Topic: Clinical - Medication Refill >> Nov 10, 2023  9:25 AM Aisha D wrote: Medication: amphetamine -dextroamphetamine (ADDERALL XR) 10 MG 24 hr capsule  Has the patient contacted their pharmacy? No (Agent: If no, request that the patient contact the pharmacy for the refill. If patient does not wish to contact the pharmacy document the reason why and proceed with request.) (Agent: If yes, when and what did the pharmacy advise?)  This is the patient's preferred pharmacy:  [facility] [phone] [facility], [place] - [address] [place] [address] Phone: [phone] Fax: [phone]  Is this the correct pharmacy for this prescription? Yes If no, delete pharmacy and type the correct one.   Has the prescription been filled recently? No  Is the patient out of the medication? No, 3 left  Has the patient been seen for an appointment in the last year OR does the patient have an upcoming appointment? Yes  Can we respond through MyChart? Yes  Agent: Please be advised that Rx refills may take up to 3 business days. We ask that you follow-up with your pharmacy.

## 2023-11-10 NOTE — Telephone Encounter (Signed)
 LOV:  08/23/23 NOV: nothing scheduled Last Refill: amphetamine -dextroamphetamine (ADDERALL XR) 10 MG 24 hr capsule 10/14/23 30 capsules 0 refills

## 2023-11-10 NOTE — Telephone Encounter (Unsigned)
 Copied from CRM [id number]. Topic: Clinical - Medication Refill >> Nov 10, 2023  9:20 AM Aisha D wrote: Medication: amphetamine -dextroamphetamine (ADDERALL XR) 10 MG 24 hr capsule  Has the patient contacted their pharmacy? No (Agent: If no, request that the patient contact the pharmacy for the refill. If patient does not wish to contact the pharmacy document the reason why and proceed with request.) (Agent: If yes, when and what did the pharmacy advise?)  This is the patient's preferred pharmacy:  [facility] [phone] [facility], [place] - [address] [place] [address] Phone: [phone] Fax: [phone]  Is this the correct pharmacy for this prescription? Yes If no, delete pharmacy and type the correct one.   Has the prescription been filled recently? No  Is the patient out of the medication? No, 3 left  Has the patient been seen for an appointment in the last year OR does the patient have an upcoming appointment? Yes  Can we respond through MyChart? Yes  Agent: Please be advised that Rx refills may take up to 3 business days. We ask that you follow-up with your pharmacy.

## 2023-11-11 MED ORDER — AMPHETAMINE-DEXTROAMPHET ER 10 MG PO CP24: 10.0000 mg | ORAL_CAPSULE | Freq: Every day | ORAL | 0 refills | Status: DC

## 2023-11-11 NOTE — Telephone Encounter (Signed)
 Sent. Thanks.

## 2023-11-24 ENCOUNTER — Other Ambulatory Visit: Payer: Self-pay | Admitting: Family Medicine

## 2023-11-24 DIAGNOSIS — I10 Essential (primary) hypertension: Secondary | ICD-10-CM

## 2023-11-29 ENCOUNTER — Other Ambulatory Visit: Payer: Self-pay | Admitting: Family Medicine

## 2023-12-01 DIAGNOSIS — H0102A Squamous blepharitis right eye, upper and lower eyelids: Secondary | ICD-10-CM | POA: Diagnosis not present

## 2023-12-01 DIAGNOSIS — H2513 Age-related nuclear cataract, bilateral: Secondary | ICD-10-CM | POA: Diagnosis not present

## 2023-12-01 DIAGNOSIS — H40053 Ocular hypertension, bilateral: Secondary | ICD-10-CM | POA: Diagnosis not present

## 2023-12-01 DIAGNOSIS — H1045 Other chronic allergic conjunctivitis: Secondary | ICD-10-CM | POA: Diagnosis not present

## 2023-12-01 DIAGNOSIS — H35033 Hypertensive retinopathy, bilateral: Secondary | ICD-10-CM | POA: Diagnosis not present

## 2023-12-01 DIAGNOSIS — H0102B Squamous blepharitis left eye, upper and lower eyelids: Secondary | ICD-10-CM | POA: Diagnosis not present

## 2023-12-01 DIAGNOSIS — H04123 Dry eye syndrome of bilateral lacrimal glands: Secondary | ICD-10-CM | POA: Diagnosis not present

## 2023-12-09 ENCOUNTER — Other Ambulatory Visit: Payer: Self-pay | Admitting: Family Medicine

## 2023-12-09 DIAGNOSIS — F988 Other specified behavioral and emotional disorders with onset usually occurring in childhood and adolescence: Secondary | ICD-10-CM

## 2023-12-09 NOTE — Telephone Encounter (Signed)
 Copied from CRM #[id number]. Topic: Clinical - Medication Refill >> Dec 09, 2023  8:11 AM Macario HERO wrote: Medication: amphetamine -dextroamphetamine (ADDERALL XR) 10 MG 24 hr capsule [[id number]]  Has the patient contacted their pharmacy? No (Agent: If no, request that the patient contact the pharmacy for the refill. If patient does not wish to contact the pharmacy document the reason why and proceed with request.) (Agent: If yes, when and what did the pharmacy advise?)  This is the patient's preferred pharmacy:  [facility] [phone] [facility], [place] - [address] [place] [address] Phone: [phone] Fax: [phone]  Is this the correct pharmacy for this prescription? Yes If no, delete pharmacy and type the correct one.   Has the prescription been filled recently? Yes  Is the patient out of the medication? No, almost  Has the patient been seen for an appointment in the last year OR does the patient have an upcoming appointment? Yes  Can we respond through MyChart? Yes  Agent: Please be advised that Rx refills may take up to 3 business days. We ask that you follow-up with your pharmacy.

## 2023-12-09 NOTE — Telephone Encounter (Signed)
 Last OV: 08/23/23 Pending OV: Nothing scheduled at this time Medication: Adderall XR 20mg  Directions: Take one tablet daily Last Refill: 11/11/23 Qty: #30 with 0 refills

## 2023-12-11 MED ORDER — AMPHETAMINE-DEXTROAMPHET ER 10 MG PO CP24: 10.0000 mg | ORAL_CAPSULE | Freq: Every day | ORAL | 0 refills | Status: DC

## 2023-12-17 ENCOUNTER — Other Ambulatory Visit: Payer: Self-pay | Admitting: Family Medicine

## 2023-12-19 NOTE — Telephone Encounter (Signed)
 Pt needs annual exam with Dr Cleatus. Rx sent for 90 days.

## 2023-12-25 ENCOUNTER — Other Ambulatory Visit: Payer: Self-pay | Admitting: Family Medicine

## 2023-12-25 DIAGNOSIS — I10 Essential (primary) hypertension: Secondary | ICD-10-CM

## 2023-12-25 DIAGNOSIS — M109 Gout, unspecified: Secondary | ICD-10-CM

## 2024-01-06 ENCOUNTER — Other Ambulatory Visit (INDEPENDENT_AMBULATORY_CARE_PROVIDER_SITE_OTHER)

## 2024-01-06 DIAGNOSIS — M109 Gout, unspecified: Secondary | ICD-10-CM | POA: Diagnosis not present

## 2024-01-06 DIAGNOSIS — I10 Essential (primary) hypertension: Secondary | ICD-10-CM

## 2024-01-06 LAB — COMPREHENSIVE METABOLIC PANEL WITH GFR
ALT: 9 U/L (ref 0–53)
AST: 13 U/L (ref 0–37)
Albumin: 4.7 g/dL (ref 3.5–5.2)
Alkaline Phosphatase: 48 U/L (ref 39–117)
BUN: 23 mg/dL (ref 6–23)
CO2: 30 meq/L (ref 19–32)
Calcium: 9.8 mg/dL (ref 8.4–10.5)
Chloride: 102 meq/L (ref 96–112)
Creatinine, Ser: 1.25 mg/dL (ref 0.40–1.50)
GFR: 58 mL/min — ABNORMAL LOW (ref >60.00)
Glucose, Bld: 93 mg/dL (ref 70–99)
Potassium: 4.1 meq/L (ref 3.5–5.1)
Sodium: 140 meq/L (ref 135–145)
Total Bilirubin: 0.6 mg/dL (ref 0.2–1.2)
Total Protein: 7 g/dL (ref 6.0–8.3)

## 2024-01-06 LAB — CBC WITH DIFFERENTIAL/PLATELET
Basophils Absolute: 0 K/uL (ref 0.0–0.1)
Basophils Relative: 0.5 % (ref 0.0–3.0)
Eosinophils Absolute: 0.3 K/uL (ref 0.0–0.7)
Eosinophils Relative: 2.8 % (ref 0.0–5.0)
HCT: 45.4 % (ref 39.0–52.0)
Hemoglobin: 15.1 g/dL (ref 13.0–17.0)
Lymphocytes Relative: 27.9 % (ref 12.0–46.0)
Lymphs Abs: 2.6 K/uL (ref 0.7–4.0)
MCHC: 33.2 g/dL (ref 30.0–36.0)
MCV: 88.3 fl (ref 78.0–100.0)
Monocytes Absolute: 0.9 K/uL (ref 0.1–1.0)
Monocytes Relative: 9.7 % (ref 3.0–12.0)
Neutro Abs: 5.5 K/uL (ref 1.4–7.7)
Neutrophils Relative %: 59.1 % (ref 43.0–77.0)
Platelets: 232 K/uL (ref 150.0–400.0)
RBC: 5.14 Mil/uL (ref 4.22–5.81)
RDW: 14.1 % (ref 11.5–15.5)
WBC: 9.4 K/uL (ref 4.0–10.5)

## 2024-01-06 LAB — LIPID PANEL
Cholesterol: 229 mg/dL — ABNORMAL HIGH (ref 0–200)
HDL: 67.2 mg/dL (ref >39.00)
LDL Cholesterol: 137 mg/dL — ABNORMAL HIGH (ref 0–99)
NonHDL: 161.63
Total CHOL/HDL Ratio: 3
Triglycerides: 121 mg/dL (ref 0.0–149.0)
VLDL: 24.2 mg/dL (ref 0.0–40.0)

## 2024-01-06 LAB — TSH: TSH: 3.93 u[IU]/mL (ref 0.35–5.50)

## 2024-01-06 LAB — URIC ACID: Uric Acid, Serum: 7.1 mg/dL (ref 4.0–7.8)

## 2024-01-08 ENCOUNTER — Ambulatory Visit: Payer: Self-pay | Admitting: Family Medicine

## 2024-01-11 ENCOUNTER — Other Ambulatory Visit: Payer: Self-pay | Admitting: Family Medicine

## 2024-01-11 DIAGNOSIS — F988 Other specified behavioral and emotional disorders with onset usually occurring in childhood and adolescence: Secondary | ICD-10-CM

## 2024-01-11 MED ORDER — AMPHETAMINE-DEXTROAMPHET ER 10 MG PO CP24: 10.0000 mg | ORAL_CAPSULE | Freq: Every day | ORAL | 0 refills | Status: DC

## 2024-01-11 NOTE — Telephone Encounter (Signed)
 Copied from CRM #[id number]. Topic: Clinical - Medication Refill >> Jan 11, 2024  9:34 AM Delon T wrote: Medication: amphetamine -dextroamphetamine (ADDERALL XR) 10 MG 24 hr capsule  Has the patient contacted their pharmacy? Yes (Agent: If no, request that the patient contact the pharmacy for the refill. If patient does not wish to contact the pharmacy document the reason why and proceed with request.) (Agent: If yes, when and what did the pharmacy advise?)  This is the patient's preferred pharmacy:  [facility] [phone] [facility], [place] - [address] [place] [address] Phone: [phone] Fax: [phone]  Is this the correct pharmacy for this prescription? Yes If no, delete pharmacy and type the correct one.   Has the prescription been filled recently? Yes  Is the patient out of the medication? Yes  Has the patient been seen for an appointment in the last year OR does the patient have an upcoming appointment? Yes  Can we respond through MyChart? Yes  Agent: Please be advised that Rx refills may take up to 3 business days. We ask that you follow-up with your pharmacy.

## 2024-01-11 NOTE — Telephone Encounter (Signed)
 Last OV: 08/23/23 Pending OV: 01/13/24 Medication: amphetamine -dextroamphetamine (adderall xr) 10 mg  Last Refill: 12/01/23 Qty: #30 with 0 refills

## 2024-01-13 ENCOUNTER — Ambulatory Visit: Admitting: Family Medicine

## 2024-01-13 VITALS — BP 120/72 | HR 63 | Temp 98.2°F | Ht 67.0 in | Wt 185.0 lb

## 2024-01-13 DIAGNOSIS — Z1211 Encounter for screening for malignant neoplasm of colon: Secondary | ICD-10-CM | POA: Diagnosis not present

## 2024-01-13 DIAGNOSIS — I10 Essential (primary) hypertension: Secondary | ICD-10-CM

## 2024-01-13 DIAGNOSIS — Z0000 Encounter for general adult medical examination without abnormal findings: Secondary | ICD-10-CM

## 2024-01-13 DIAGNOSIS — G2581 Restless legs syndrome: Secondary | ICD-10-CM

## 2024-01-13 DIAGNOSIS — F988 Other specified behavioral and emotional disorders with onset usually occurring in childhood and adolescence: Secondary | ICD-10-CM

## 2024-01-13 DIAGNOSIS — M109 Gout, unspecified: Secondary | ICD-10-CM | POA: Diagnosis not present

## 2024-01-13 DIAGNOSIS — R399 Unspecified symptoms and signs involving the genitourinary system: Secondary | ICD-10-CM | POA: Diagnosis not present

## 2024-01-13 DIAGNOSIS — Z7189 Other specified counseling: Secondary | ICD-10-CM

## 2024-01-13 MED ORDER — AMPHETAMINE-DEXTROAMPHET ER 10 MG PO CP24: 10.0000 mg | ORAL_CAPSULE | Freq: Every day | ORAL | 0 refills | Status: AC

## 2024-01-13 MED ORDER — LOSARTAN POTASSIUM 25 MG PO TABS: 25.0000 mg | ORAL_TABLET | Freq: Every day | ORAL | 3 refills | Status: AC

## 2024-01-13 MED ORDER — TAMSULOSIN HCL 0.4 MG PO CAPS: 0.8000 mg | ORAL_CAPSULE | Freq: Every day | ORAL | 3 refills | Status: AC

## 2024-01-13 NOTE — Progress Notes (Unsigned)
 Flu 2025 Shingles prev done.  RSV done PNA d/w pt.   Tetanus 2017 COVID-vaccine previously done Cologuard ordered 2025.   Prostate cancer screening and PSA options (with potential risks and benefits of testing vs not testing) were discussed along with recent recs/guidelines.  He declined testing PSA at this point. Advance directive-wife designated if patient were incapacitated   LUTS. Taking flomax  0.4mg  most days, occ with 2nd dose if needed.  That helps.  No ADE on med.  Nocturia x1.    Hypertension:               Using medication without problems or lightheadedness: still can get lightheaded.   Chest pain with exertion:yes Edema:no Short of breath:no   RLS improved in the meantime, with weight loss and flexeril  use.    Flexeril  helped with muscle pain.  No ADE on med.  Sleeping better on med.    Gout.  Rare colchicine  use.  It helps.  No ADE on med.    Concentration improved with adderall and no ADE on med.  My mind used to go to the left and then to the right, my problems solving is better now.    His L thumb is still sensitive to contact but not painful o/w.     Meds, vitals, and allergies reviewed.    ROS: Per HPI unless specifically indicated in ROS section    GEN: nad, alert and oriented HEENT: ncat NECK: supple w/o LA CV: rrr.  PULM: ctab, no inc wob ABD: soft, +bs EXT: no edema SKIN: no acute rash

## 2024-01-13 NOTE — Patient Instructions (Addendum)
 Let me know if you can't get the cologuard kit.  Take care.  Glad to see you. Update me as needed.

## 2024-01-17 NOTE — Assessment & Plan Note (Signed)
 Taking flomax  0.4mg  most days, occ with 2nd dose if needed.  That helps.  No ADE on med.  Nocturia x1.   Would continue as is.

## 2024-01-17 NOTE — Assessment & Plan Note (Addendum)
 Continue work on diet and exercise.  He can cut losartan  in half if needed, if lightheaded or lower blood pressure.

## 2024-01-17 NOTE — Assessment & Plan Note (Signed)
"  °  RLS improved in the meantime, with weight loss and flexeril  use.  He can update me as needed. "

## 2024-01-17 NOTE — Assessment & Plan Note (Signed)
 Flu 2025 Shingles prev done.  RSV done PNA d/w pt.   Tetanus 2017 COVID-vaccine previously done Cologuard ordered 2025.   Prostate cancer screening and PSA options (with potential risks and benefits of testing vs not testing) were discussed along with recent recs/guidelines.  He declined testing PSA at this point. Advance directive-wife designated if patient were incapacitated

## 2024-01-17 NOTE — Assessment & Plan Note (Signed)
 Advance directive- wife designated if patient were incapacitated.

## 2024-01-17 NOTE — Assessment & Plan Note (Signed)
 History of. Rare colchicine  use.  It helps.  No ADE on med.

## 2024-01-17 NOTE — Assessment & Plan Note (Signed)
 Concentration improved with adderall and no ADE on med.  My mind used to go to the left and then to the right, my problems solving is better now.   Would continue as is.  He can update me as needed.

## 2024-01-23 ENCOUNTER — Other Ambulatory Visit: Payer: Self-pay | Admitting: Family Medicine

## 2024-01-29 ENCOUNTER — Other Ambulatory Visit: Payer: Self-pay | Admitting: Family Medicine

## 2024-03-20 ENCOUNTER — Ambulatory Visit: Payer: PPO

## 2024-04-19 ENCOUNTER — Ambulatory Visit
